# Patient Record
Sex: Male | Born: 1960 | Race: White | Hispanic: No | Marital: Single | Smoking: Never smoker
Health system: Southern US, Community
[De-identification: ages and names within clinical notes are randomized; demographics above are authoritative.]

## PROBLEM LIST (undated history)

## (undated) ENCOUNTER — Emergency Department (HOSPITAL_COMMUNITY): Admission: EM | Payer: BLUE CROSS/BLUE SHIELD | Source: Home / Self Care

## (undated) DIAGNOSIS — R253 Fasciculation: Secondary | ICD-10-CM

## (undated) DIAGNOSIS — J3800 Paralysis of vocal cords and larynx, unspecified: Secondary | ICD-10-CM

## (undated) DIAGNOSIS — M545 Low back pain, unspecified: Secondary | ICD-10-CM

## (undated) DIAGNOSIS — G43909 Migraine, unspecified, not intractable, without status migrainosus: Secondary | ICD-10-CM

## (undated) DIAGNOSIS — B029 Zoster without complications: Secondary | ICD-10-CM

## (undated) HISTORY — DX: Low back pain: M54.5

## (undated) HISTORY — DX: Zoster without complications: B02.9

## (undated) HISTORY — PX: HAND SURGERY: SHX662

## (undated) HISTORY — DX: Paralysis of vocal cords and larynx, unspecified: J38.00

## (undated) HISTORY — DX: Fasciculation: R25.3

## (undated) HISTORY — DX: Low back pain, unspecified: M54.50

## (undated) HISTORY — PX: HERNIA REPAIR: SHX51

## (undated) HISTORY — DX: Migraine, unspecified, not intractable, without status migrainosus: G43.909

---

## 1998-10-27 ENCOUNTER — Ambulatory Visit (HOSPITAL_COMMUNITY): Admission: RE | Admit: 1998-10-27 | Discharge: 1998-10-27 | Payer: Self-pay | Admitting: Urology

## 1998-10-27 ENCOUNTER — Encounter: Payer: Self-pay | Admitting: Urology

## 2002-07-05 ENCOUNTER — Encounter: Payer: Self-pay | Admitting: *Deleted

## 2002-07-05 ENCOUNTER — Emergency Department (HOSPITAL_COMMUNITY): Admission: EM | Admit: 2002-07-05 | Discharge: 2002-07-05 | Payer: Self-pay | Admitting: Emergency Medicine

## 2013-09-30 ENCOUNTER — Other Ambulatory Visit: Payer: Self-pay | Admitting: Internal Medicine

## 2013-09-30 ENCOUNTER — Ambulatory Visit
Admission: RE | Admit: 2013-09-30 | Discharge: 2013-09-30 | Disposition: A | Discharge status: Home / Self Care | Payer: BC Managed Care – PPO | Source: Ambulatory Visit | Attending: Internal Medicine | Admitting: Internal Medicine

## 2013-09-30 DIAGNOSIS — R0989 Other specified symptoms and signs involving the circulatory and respiratory systems: Principal | ICD-10-CM

## 2013-09-30 DIAGNOSIS — R0609 Other forms of dyspnea: Secondary | ICD-10-CM

## 2013-10-01 ENCOUNTER — Other Ambulatory Visit: Payer: Self-pay

## 2013-10-01 ENCOUNTER — Ambulatory Visit (INDEPENDENT_AMBULATORY_CARE_PROVIDER_SITE_OTHER): Payer: BC Managed Care – PPO | Admitting: Physician Assistant

## 2013-10-01 DIAGNOSIS — R0989 Other specified symptoms and signs involving the circulatory and respiratory systems: Principal | ICD-10-CM

## 2013-10-01 DIAGNOSIS — R0609 Other forms of dyspnea: Secondary | ICD-10-CM

## 2013-10-01 NOTE — Progress Notes (Signed)
Exercise Treadmill Test  Jonathan IsaacsRobert Winters is a 53 y.o. male non-smoker with no hx of CAD, HTN, DM, HL.  Referred by PCP for ETT to evaluate CP.  Patient notes decreased exercise tolerance.  No syncope.  Exam unremarkable.  ECG normal.  Pre-Exercise Testing Evaluation Rhythm: sinus tachycardia  Rate: 93 bpm     Test  Exercise Tolerance Test Ordering MD: Donato SchultzMark Skains, MD  Interpreting MD: Tereso NewcomerScott Weaver, PA-C  Unique Test No: 1  Treadmill:  1  Indication for ETT: exertional dyspnea  Contraindication to ETT: No   Stress Modality: exercise - treadmill  Cardiac Imaging Performed: non   Protocol: standard Bruce - maximal  Max BP:  183/80  Max MPHR (bpm):  168 85% MPR (bpm):  143  MPHR obtained (bpm):  179 % MPHR obtained:  106  Reached 85% MPHR (min:sec):  3:35 Total Exercise Time (min-sec):  8:00  Workload in METS:  10.1 Borg Scale: 13  Reason ETT Terminated:  desired heart rate attained    ST Segment Analysis At Rest: normal ST segments - no evidence of significant ST depression With Exercise: no evidence of significant ST depression  Other Information Arrhythmia:  No Angina during ETT:  absent (0) Quality of ETT:  diagnostic  ETT Interpretation:  normal - no evidence of ischemia by ST analysis  Comments: Good exercise capacity. No chest pain. Normal BP response to exercise. No ST changes to suggest ischemia.   Recommendations: FU with PCP as directed. Signed,  Tereso NewcomerScott Weaver, PA-C   10/01/2013 9:57 AM

## 2014-03-15 ENCOUNTER — Emergency Department (HOSPITAL_COMMUNITY): Payer: BLUE CROSS/BLUE SHIELD

## 2014-03-15 ENCOUNTER — Encounter (HOSPITAL_COMMUNITY): Payer: Self-pay | Admitting: Oncology

## 2014-03-15 ENCOUNTER — Emergency Department (HOSPITAL_COMMUNITY)
Admission: EM | Admit: 2014-03-15 | Discharge: 2014-03-15 | Disposition: A | Discharge status: Home / Self Care | Payer: BLUE CROSS/BLUE SHIELD | Attending: Emergency Medicine | Admitting: Emergency Medicine

## 2014-03-15 DIAGNOSIS — R109 Unspecified abdominal pain: Secondary | ICD-10-CM | POA: Diagnosis not present

## 2014-03-15 DIAGNOSIS — Z9889 Other specified postprocedural states: Secondary | ICD-10-CM | POA: Insufficient documentation

## 2014-03-15 DIAGNOSIS — Z87442 Personal history of urinary calculi: Secondary | ICD-10-CM | POA: Diagnosis not present

## 2014-03-15 DIAGNOSIS — Z7982 Long term (current) use of aspirin: Secondary | ICD-10-CM | POA: Insufficient documentation

## 2014-03-15 LAB — URINALYSIS, ROUTINE W REFLEX MICROSCOPIC
BILIRUBIN URINE: NEGATIVE
GLUCOSE, UA: NEGATIVE mg/dL
KETONES UR: NEGATIVE mg/dL
LEUKOCYTES UA: NEGATIVE
Nitrite: NEGATIVE
Protein, ur: NEGATIVE mg/dL
Specific Gravity, Urine: 1.021 (ref 1.005–1.030)
Urobilinogen, UA: 1 mg/dL (ref 0.0–1.0)
pH: 5.5 (ref 5.0–8.0)

## 2014-03-15 LAB — COMPREHENSIVE METABOLIC PANEL
ALBUMIN: 4.9 g/dL (ref 3.5–5.2)
ALK PHOS: 78 U/L (ref 39–117)
ALT: 22 U/L (ref 0–53)
AST: 24 U/L (ref 0–37)
Anion gap: 8 (ref 5–15)
BILIRUBIN TOTAL: 1.8 mg/dL — AB (ref 0.3–1.2)
BUN: 9 mg/dL (ref 6–23)
CALCIUM: 9.1 mg/dL (ref 8.4–10.5)
CO2: 26 mmol/L (ref 19–32)
Chloride: 104 mmol/L (ref 96–112)
Creatinine, Ser: 0.88 mg/dL (ref 0.50–1.35)
GFR calc Af Amer: >90 mL/min (ref >90)
Glucose, Bld: 102 mg/dL — ABNORMAL HIGH (ref 70–99)
Potassium: 3.8 mmol/L (ref 3.5–5.1)
Sodium: 138 mmol/L (ref 135–145)
Total Protein: 8.1 g/dL (ref 6.0–8.3)

## 2014-03-15 LAB — CBC WITH DIFFERENTIAL/PLATELET
Basophils Absolute: 0 10*3/uL (ref 0.0–0.1)
Basophils Relative: 0 % (ref 0–1)
Eosinophils Absolute: 0.1 10*3/uL (ref 0.0–0.7)
Eosinophils Relative: 1 % (ref 0–5)
HCT: 49.3 % (ref 39.0–52.0)
Hemoglobin: 17.6 g/dL — ABNORMAL HIGH (ref 13.0–17.0)
LYMPHS ABS: 2.6 10*3/uL (ref 0.7–4.0)
LYMPHS PCT: 26 % (ref 12–46)
MCH: 29 pg (ref 26.0–34.0)
MCHC: 35.7 g/dL (ref 30.0–36.0)
MCV: 81.4 fL (ref 78.0–100.0)
MONO ABS: 1.1 10*3/uL — AB (ref 0.1–1.0)
Monocytes Relative: 11 % (ref 3–12)
NEUTROS ABS: 6.3 10*3/uL (ref 1.7–7.7)
Neutrophils Relative %: 62 % (ref 43–77)
Platelets: 178 10*3/uL (ref 150–400)
RBC: 6.06 MIL/uL — ABNORMAL HIGH (ref 4.22–5.81)
RDW: 13.3 % (ref 11.5–15.5)
WBC: 10.1 10*3/uL (ref 4.0–10.5)

## 2014-03-15 LAB — URINE MICROSCOPIC-ADD ON

## 2014-03-15 MED ORDER — ONDANSETRON HCL 4 MG PO TABS
4.0000 mg | ORAL_TABLET | Freq: Three times a day (TID) | ORAL | Status: DC | PRN
Start: 2014-03-15 — End: 2015-11-23

## 2014-03-15 MED ORDER — KETOROLAC TROMETHAMINE 30 MG/ML IJ SOLN
30.0000 mg | Freq: Once | INTRAMUSCULAR | Status: AC
  Administered 2014-03-15: 30 mg via INTRAVENOUS
  Filled 2014-03-15: qty 1

## 2014-03-15 MED ORDER — OXYCODONE-ACETAMINOPHEN 5-325 MG PO TABS
1.0000 | ORAL_TABLET | Freq: Once | ORAL | Status: AC
  Administered 2014-03-15: 1 via ORAL
  Filled 2014-03-15: qty 1

## 2014-03-15 MED ORDER — HYDROCODONE-ACETAMINOPHEN 5-325 MG PO TABS: 1.0000 | ORAL_TABLET | Freq: Four times a day (QID) | ORAL | Status: DC | PRN

## 2014-03-15 NOTE — Discharge Instructions (Signed)
Flank Pain °Flank pain refers to pain that is located on the side of the body between the upper abdomen and the back. The pain may occur over a short period of time (acute) or may be long-term or reoccurring (chronic). It may be mild or severe. Flank pain can be caused by many things. °CAUSES  °Some of the more common causes of flank pain include: °· Muscle strains.   °· Muscle spasms.   °· A disease of your spine (vertebral disk disease).   °· A lung infection (pneumonia).   °· Fluid around your lungs (pulmonary edema).   °· A kidney infection.   °· Kidney stones.   °· A very painful skin rash caused by the chickenpox virus (shingles).   °· Gallbladder disease.   °HOME CARE INSTRUCTIONS  °Home care will depend on the cause of your pain. In general, °· Rest as directed by your caregiver. °· Drink enough fluids to keep your urine clear or pale yellow. °· Only take over-the-counter or prescription medicines as directed by your caregiver. Some medicines may help relieve the pain. °· Tell your caregiver about any changes in your pain. °· Follow up with your caregiver as directed. °SEEK IMMEDIATE MEDICAL CARE IF:  °· Your pain is not controlled with medicine.   °· You have new or worsening symptoms. °· Your pain increases.   °· You have abdominal pain.   °· You have shortness of breath.   °· You have persistent nausea or vomiting.   °· You have swelling in your abdomen.   °· You feel faint or pass out.   °· You have blood in your urine. °· You have a fever or persistent symptoms for more than 2-3 days. °· You have a fever and your symptoms suddenly get worse. °MAKE SURE YOU:  °· Understand these instructions. °· Will watch your condition. °· Will get help right away if you are not doing well or get worse. °Document Released: 02/09/2005 Document Revised: 09/13/2011 Document Reviewed: 08/03/2011 °ExitCare® Patient Information ©2015 [employer]. This information is not intended to replace advice given to you by your  health care provider. Make sure you discuss any questions you have with your health care provider. ° °

## 2014-03-15 NOTE — ED Notes (Signed)
Pain has decreased in the lower back but lower abd pain is radiating to upper/mid abd pain.

## 2014-03-15 NOTE — ED Provider Notes (Signed)
CSN: [id number]     Arrival date & time 03/15/14  1951 History   First MD Initiated Contact with Patient 03/15/14 2100     Chief Complaint  Patient presents with  . Flank Pain     (Consider location/radiation/quality/duration/timing/severity/associated sxs/prior Treatment) HPI   54 year old male with no prior history of kidney stone presents for evaluation of left flank pain. Patient reports for the past 2 days he has had intermittent left flank pain. He described pain as a dull achy sensation with occasional sharp intense pain that is waxing and waning. He felt that it may be a pulled muscle but despite positional change to notice no improvement. Today the pain is traveling down to his left low abdomen. Pain is 9 out of 10, mildly improved with taking ibuprofen and Excedrin at home. He was unable to sleep due to the pain. He did felt nauseous once but did not vomit. No associated fever, chills, chest pain, shortness of breath, productive cough, dysuria, hematuria, hematochezia or melena. Is able to pass flatus, last bowel movement was today. No prior history of kidney stones. Denies any specific injury. States that he pulled muscle in the past but this felt different. He did receive 1 Percocet while waiting in states it brought the pain from a 9 out of 10 to 8 out of 10. Prior history of inguinal hernia repair  History reviewed. No pertinent past medical history. Past Surgical History  Procedure Laterality Date  . Hernia repair     History reviewed. No pertinent family history. History  Substance Use Topics  . Smoking status: Never Smoker   . Smokeless tobacco: Not on file  . Alcohol Use: Yes     Comment: occasionally    Review of Systems  Constitutional: Negative for fever.  Gastrointestinal: Positive for abdominal pain.  Genitourinary: Positive for flank pain. Negative for dysuria, scrotal swelling and testicular pain.  Skin: Negative for rash and wound.  Neurological: Negative  for numbness.      Allergies  Review of patient's allergies indicates no known allergies.  Home Medications   Prior to Admission medications   Medication Sig Start Date End Date Taking? Authorizing Provider  aspirin-acetaminophen-caffeine (EXCEDRIN MIGRAINE) [phone] MG per tablet Take 2 tablets by mouth every 6 (six) hours as needed (back pain).   Yes Historical Provider, MD  ibuprofen (ADVIL,MOTRIN) 200 MG tablet Take 400 mg by mouth every 6 (six) hours as needed for moderate pain (pain).   Yes Historical Provider, MD   BP 153/100 mmHg  Pulse 93  Temp(Src) 97.9 F (36.6 C) (Oral)  Resp 16  Ht  (1.753 m)  Wt 195 lb (88.451 kg)  BMI 28.78 kg/m2  SpO2 100% Physical Exam  Constitutional: He appears well-developed and well-nourished. No distress.  HENT:  Head: Atraumatic.  Eyes: Conjunctivae are normal.  Neck: Normal range of motion. Neck supple.  Cardiovascular: Normal rate and regular rhythm.   Pulmonary/Chest: Effort normal and breath sounds normal.  Abdominal: Soft. Bowel sounds are normal. He exhibits no distension. There is no tenderness.  Genitourinary:  Mild left CVA tenderness on percussion. No significant midline spine tenderness  Chaperone present during exam. Patient has normal circumcised penis with normal penile shaft, no testicular tenderness on palpation, no inguinal hernia noted.  Neurological: He is alert.  Skin: No rash noted.  Psychiatric: He has a normal mood and affect.    ED Course  Procedures (including critical care time)  Patient presents with left flank pain suggestive  of kidney stones. Workup initiated. Pain medication given. He has a benign abdominal exam.  11:26 PM Labs are reassuring, UA with trace hemoglobin, but no signs of urinary tract infection. CT renal stone demonstrated right nephrolithiasis but otherwise no acute finding. On reexamination, patient has a benign abdomen, he is able to tolerate by mouth and felt better. I  suspect patient may have passed a kidney stone. We'll give outpatient follow-up with urology as needed. Return precautions discussed.  Labs Review Labs Reviewed  CBC WITH DIFFERENTIAL/PLATELET - Abnormal; Notable for the following:    RBC 6.06 (*)    Hemoglobin 17.6 (*)    Monocytes Absolute 1.1 (*)    All other components within normal limits  COMPREHENSIVE METABOLIC PANEL - Abnormal; Notable for the following:    Glucose, Bld 102 (*)    Total Bilirubin 1.8 (*)    All other components within normal limits  URINALYSIS, ROUTINE W REFLEX MICROSCOPIC - Abnormal; Notable for the following:    Color, Urine AMBER (*)    Hgb urine dipstick TRACE (*)    All other components within normal limits  URINE MICROSCOPIC-ADD ON - Abnormal; Notable for the following:    Bacteria, UA FEW (*)    All other components within normal limits    Imaging Review Ct Renal Stone Study  03/15/2014   CLINICAL DATA:  Intermittent left flank pain, 2-3 day duration. Initial encounter  EXAM: CT ABDOMEN AND PELVIS WITHOUT CONTRAST  TECHNIQUE: Multidetector CT imaging of the abdomen and pelvis was performed following the standard protocol without IV contrast.  COMPARISON:  None.  FINDINGS: There is a 3 x 5 mm upper pole right renal collecting system calculus. There is no ureteral calculus. There is no hydronephrosis or ureteral dilatation. There are unremarkable unenhanced appearances of the liver, spleen, pancreas, adrenals and appendix. Abdominal aorta is normal in caliber. There is no acute inflammatory change in the abdomen or pelvis. There is no adenopathy. There is no ascites. There is no significant abnormality in the lower chest. No significant musculoskeletal abnormalities are evident.  IMPRESSION: Right nephrolithiasis.  No acute findings.   Electronically Signed   By: Ellery Plunkaniel R Mitchell M.D.   On: 03/15/2014 22:03     EKG Interpretation None      MDM   Final diagnoses:  Left flank pain    BP 153/100 mmHg   Pulse 93  Temp(Src) 97.9 F (36.6 C) (Oral)  Resp 16  Ht 5\' 9"  (1.753 m)  Wt 195 lb (88.451 kg)  BMI 28.78 kg/m2  SpO2 100%  I have reviewed nursing notes and vital signs. I personally reviewed the imaging tests through PACS system  I reviewed available ER/hospitalization records thought the EMR     Fayrene HelperBowie Aubreyanna Dorrough, PA-C 03/15/14 2341  Nelva Nayobert Beaton, MD 03/19/14 [phone]

## 2014-03-15 NOTE — ED Notes (Signed)
Denies blood in urine or problems with urinating.

## 2014-03-15 NOTE — ED Notes (Signed)
Pt began to have intermittent left flank pain Friday.  Pt rates pain 9/10, sharp in nature.  Pt is also c/o lower b/l abdominal pain.  Pt has been unable to sleep d/t pain.

## 2015-10-26 ENCOUNTER — Ambulatory Visit
Admission: RE | Admit: 2015-10-26 | Discharge: 2015-10-26 | Disposition: A | Discharge status: Home / Self Care | Payer: BLUE CROSS/BLUE SHIELD | Source: Ambulatory Visit | Attending: Internal Medicine | Admitting: Internal Medicine

## 2015-10-26 ENCOUNTER — Other Ambulatory Visit: Payer: Self-pay | Admitting: Internal Medicine

## 2015-10-26 DIAGNOSIS — Z0001 Encounter for general adult medical examination with abnormal findings: Secondary | ICD-10-CM

## 2015-10-26 DIAGNOSIS — M542 Cervicalgia: Secondary | ICD-10-CM

## 2015-11-04 ENCOUNTER — Other Ambulatory Visit: Payer: Self-pay | Admitting: Internal Medicine

## 2015-11-04 DIAGNOSIS — M5412 Radiculopathy, cervical region: Secondary | ICD-10-CM

## 2015-11-18 ENCOUNTER — Other Ambulatory Visit: Payer: BLUE CROSS/BLUE SHIELD

## 2015-11-23 ENCOUNTER — Ambulatory Visit (INDEPENDENT_AMBULATORY_CARE_PROVIDER_SITE_OTHER): Payer: BLUE CROSS/BLUE SHIELD | Admitting: Neurology

## 2015-11-23 ENCOUNTER — Encounter: Payer: Self-pay | Admitting: Neurology

## 2015-11-23 VITALS — BP 142/94 | HR 98 | Ht 69.0 in | Wt 207.0 lb

## 2015-11-23 DIAGNOSIS — R251 Tremor, unspecified: Secondary | ICD-10-CM | POA: Insufficient documentation

## 2015-11-23 MED ORDER — PROPRANOLOL HCL 10 MG PO TABS: ORAL_TABLET | ORAL | 2 refills | Status: DC

## 2015-11-23 NOTE — Patient Instructions (Signed)
   Inderal (propranolol) is a blood pressure medication that is commonly used for migraine headaches. This is a type of beta blocker. The most common side effects include low heart rate, dizziness, fatigue, and increased depression. This medication may worsen asthma. If you believe that you are having side effects on this medication, please contact our office.  

## 2015-11-23 NOTE — Progress Notes (Signed)
Reason for visit: Tremor  Referring physician: Dr. Birdie SonsGates  Jonathan Winters is a 55 y.o. male  History of present illness:  [name] is a 55 year old right-handed white male with a history of diffuse fasciculations throughout the body over the last 18 years. This has been unassociated with any weakness. The patient denies any muscle cramps. The patient may have some stiffness sensations in the calf muscles bilaterally. Six months ago, the patient began noting some tremor involving the left hand primarily. He will note a tremor when he extends the fingers of the hand or when he flexes the fingers against a load. The fourth and fifth fingers are affected out of proportion to the other fingers of the left hand, he does also have a mild tremor involving the right hand too. The patient has no component of a resting tremor. The patient has not had any head or neck tremor, no vocal tremor is noted. There is no family history of tremor. The patient reports no numbness or weakness of the extremities, he denies any issues controlling the bowels or the bladder, he denies any falls. He does have a history of occasional migraine headaches. In the past he has had some problems with dizziness with standing, this goes away when he sits or lies down. He does report some neck discomfort, but he denies any pain going down the arms from the neck. He comes to this office for an evaluation. A thyroid study has been done recently, the patient was told that this was unremarkable.   Past Medical History:  Diagnosis Date  . Fasciculations   . Lower back pain   . Migraine   . Shingles   . Vocal cord paralysis     Past Surgical History:  Procedure Laterality Date  . HAND SURGERY Left   . HERNIA REPAIR      Family History  Problem Relation Age of Onset  . Congestive Heart Failure Father   . COPD Father     Social history:  reports that he has never smoked. He has never used smokeless tobacco. He reports that  he drinks alcohol. He reports that he does not use drugs.  Medications:  Prior to Admission medications   Medication Sig Start Date End Date Taking? Authorizing Provider  aspirin-acetaminophen-caffeine (EXCEDRIN MIGRAINE) [phone] MG per tablet Take 2 tablets by mouth every 6 (six) hours as needed for headache.    Yes Historical Provider, MD     No Known Allergies  ROS:  Out of a complete 14 system review of symptoms, the patient complains only of the following symptoms, and all other reviewed systems are negative.  Joint pain, achy muscles Tremor  Blood pressure (!) 142/94, pulse 98, height 5\' 9"  (1.753 m), weight 207 lb (93.9 kg).  Physical Exam  General: The patient is alert and cooperative at the time of the examination.  Eyes: Pupils are equal, round, and reactive to light. Discs are flat bilaterally.  Neck: The neck is supple, no carotid bruits are noted.  Respiratory: The respiratory examination is clear.  Cardiovascular: The cardiovascular examination reveals a regular rate and rhythm, no obvious murmurs or rubs are noted.  Skin: Extremities are without significant edema.  Neurologic Exam  Mental status: The patient is alert and oriented x 3 at the time of the examination. The patient has apparent normal recent and remote memory, with an apparently normal attention span and concentration ability.  Cranial nerves: Facial symmetry is present. There is good sensation  of the face to pinprick and soft touch bilaterally. The strength of the facial muscles and the muscles to head turning and shoulder shrug are normal bilaterally. Speech is well enunciated, no aphasia or dysarthria is noted. Extraocular movements are full. Visual fields are full. The tongue is midline, and the patient has symmetric elevation of the soft palate. No obvious hearing deficits are noted.  Motor: The motor testing reveals 5 over 5 strength of all 4 extremities. Good symmetric motor tone is noted  throughout.  Sensory: Sensory testing is intact to pinprick, soft touch, vibration sensation, and position sense on all 4 extremities. No evidence of extinction is noted.  Coordination: Cerebellar testing reveals good finger-nose-finger and heel-to-shin bilaterally. The patient has a fine action type tremor with finger-nose-finger bilaterally. With arms outstretched, the patient has a prominent tremor of the left hand affecting the fourth and fifth fingers most significantly.  Gait and station: Gait is normal. Tandem gait is normal. Romberg is negative. No drift is seen.  Reflexes: Deep tendon reflexes are symmetric and normal bilaterally. Toes are downgoing bilaterally.   Assessment/Plan:  1. Tremor, physiologic type  The patient has a fine action tremor that is similar to a physiologic tremor. The patient however has not had this tremor throughout his life. The patient has had a thyroid study done recently. He will be set up for MRI evaluation of the brain to exclude demyelinating disease or cerebrovascular disease as an etiology. The patient will be placed on propranolol to help reduce the tremor severity. He will follow-up in 3 or 4 months.  Marlan Palau. Keith Willis MD 11/23/2015 2:22 PM  [facility] [address] [place], [place] [id number]  Phone [phone] Fax [phone]

## 2015-11-24 ENCOUNTER — Telehealth: Payer: Self-pay | Admitting: Neurology

## 2015-11-24 NOTE — Telephone Encounter (Signed)
I called for the peer to peer review, the MRI the brain has been authorized. The number is [id number], expiration date 01/22/2016.

## 2015-11-24 NOTE — Telephone Encounter (Signed)
This patient MRI is requiring a P2P please call [phone] within the next two business days. Since there is a holiday it will close Tuesday the 28th. Reference number not given.

## 2015-11-29 NOTE — Telephone Encounter (Signed)
Noted, thank you

## 2015-12-06 ENCOUNTER — Telehealth: Payer: Self-pay | Admitting: Neurology

## 2015-12-06 NOTE — Telephone Encounter (Signed)
Spoke with the patient informed him that the order was sent to GI and the Berkley Harveyauth has been taking care of.. He stated that [facility] and scheduled [facility].

## 2015-12-06 NOTE — Telephone Encounter (Signed)
Pt called inquiring about MRI. Msg relayed the order was sent to GI. Pt said that is ok but at OV [facility]. Please call, he wants this done before the end of the month.

## 2015-12-09 ENCOUNTER — Ambulatory Visit
Admission: RE | Admit: 2015-12-09 | Discharge: 2015-12-09 | Disposition: A | Discharge status: Home / Self Care | Payer: BLUE CROSS/BLUE SHIELD | Source: Ambulatory Visit | Attending: Neurology | Admitting: Neurology

## 2015-12-09 DIAGNOSIS — R251 Tremor, unspecified: Secondary | ICD-10-CM

## 2015-12-09 MED ORDER — GADOBENATE DIMEGLUMINE 529 MG/ML IV SOLN
19.0000 mL | Freq: Once | INTRAVENOUS | Status: AC | PRN
  Administered 2015-12-09: 19 mL via INTRAVENOUS

## 2015-12-10 ENCOUNTER — Telehealth: Payer: Self-pay | Admitting: Neurology

## 2015-12-10 NOTE — Telephone Encounter (Signed)
I called the patient. The MRI of the brain is normal. He is to start the propranolol soon.   MRI brain 12/10/15:  IMPRESSION:  Unremarkable MRI scan of the brain with and without contrast

## 2016-03-22 ENCOUNTER — Encounter: Payer: Self-pay | Admitting: Neurology

## 2016-03-22 ENCOUNTER — Ambulatory Visit (INDEPENDENT_AMBULATORY_CARE_PROVIDER_SITE_OTHER): Payer: BLUE CROSS/BLUE SHIELD | Admitting: Neurology

## 2016-03-22 VITALS — BP 147/94 | HR 103 | Resp 20 | Ht 69.0 in | Wt 216.0 lb

## 2016-03-22 DIAGNOSIS — R251 Tremor, unspecified: Secondary | ICD-10-CM | POA: Diagnosis not present

## 2016-03-22 NOTE — Progress Notes (Signed)
Reason for visit: Tremor  Jonathan Winters is an 56 y.o. male  History of present illness:  Jonathan Winters is a 56 year old right-handed white male with a history of classic migraine headache and a history of tremor. The patient has tremor that is most prominent on the left hand affecting the fourth and fifth fingers primarily, but he does have tremor on both sides with a fine physiologic type tremor. He does have some troubles with typing. He was placed on propranolol, but he took the medication only for a short time. He was taking a very low dose and he did not note any significant benefit with the tremor. The patient gives a history of problems with exercise intolerance that has been present for at least 2 years, he had a stress test done around that time that was unremarkable. He has had some problems with an elevated heart rate over at least 2 years running in the 90s. Within the last several months he has noted increasing problems with near syncopal events with standing up while at work. He will sit at his workstation for 2 or 3 hours at a time, when he stands up he may get dizzy, muffled hearing, and a sensation of near-syncope. He will have tunnel vision. After a few moments, this resolves and he feels better. He has not actually blacked out. The events are occurring almost daily. He denies any problems when he first gets out of bed in the morning. The patient has had gradual worsening of exercise intolerance, he is no longer able to work out or run. He denies any chest pains, he does have palpitations of the heart at times with heart racing.  Past Medical History:  Diagnosis Date  . Fasciculations   . Lower back pain   . Migraine   . Shingles   . Vocal cord paralysis     Past Surgical History:  Procedure Laterality Date  . HAND SURGERY Left   . HERNIA REPAIR      Family History  Problem Relation Age of Onset  . Congestive Heart Failure Father   . COPD Father     Social history:   reports that he has never smoked. He has never used smokeless tobacco. He reports that he drinks alcohol. He reports that he does not use drugs.   No Known Allergies  Medications:  Prior to Admission medications   Medication Sig Start Date End Date Taking? Authorizing Provider  aspirin-acetaminophen-caffeine (EXCEDRIN MIGRAINE) [phone] MG per tablet Take 2 tablets by mouth every 6 (six) hours as needed for headache.    Yes Historical Provider, MD  propranolol (INDERAL) 10 MG tablet One tablet twice a day for 2 weeks, then take 2 tablets twice a day Patient not taking: Reported on 03/22/2016 11/23/15   York Spaniel, MD    ROS:  Out of a complete 14 system review of symptoms, the patient complains only of the following symptoms, and all other reviewed systems are negative.  Dizziness, tremors Neck pain  Blood pressure (!) 147/94, pulse (!) 103, resp. rate 20, height 5\' 9"  (1.753 m), weight 216 lb (98 kg).   Blood pressure, right arm, sitting is 144/88 with a heart rate of 96. Blood pressure, right arm, standing is 144/80 heart rate 96.  Physical Exam  General: The patient is alert and cooperative at the time of the examination.  Skin: No significant peripheral edema is noted.   Neurologic Exam  Mental status: The patient is alert and  oriented x 3 at the time of the examination. The patient has apparent normal recent and remote memory, with an apparently normal attention span and concentration ability.   Cranial nerves: Facial symmetry is present. Speech is normal, no aphasia or dysarthria is noted. Extraocular movements are full. Visual fields are full. Pupils are equal, round, and reactive to light. Discs are flat bilaterally.  Motor: The patient has good strength in all 4 extremities.  Sensory examination: Soft touch sensation is symmetric on the face, arms, and legs.  Coordination: The patient has good finger-nose-finger and heel-to-shin bilaterally. The patient has  tremors with the arms outstretched involving primarily the left hand with the fourth and fifth fingers, a fine action tremor is seen bilaterally with finger-nose-finger, worse on the left.  Gait and station: The patient has a normal gait. Tandem gait is normal. Romberg is negative. No drift is seen.  Reflexes: Deep tendon reflexes are symmetric.    MRI brain 12/10/15:  IMPRESSION: Unremarkable MRI scan of the brain with and without contrast  * MRI scan images were reviewed online. I agree with the written report.    Assessment/Plan:  1. Left hand tremor  2. Classic migraine headache  3. Exercise intolerance, reports of near-syncope  The patient has had gradual worsening problems with exercise intolerance, he chronically appears to run heart rates in the 90s. He does not appear to be orthostatic on the examination today, but his description of symptoms appears to be consistent with transient episodes of low blood pressure with standing after prolonged periods of sitting. The patient will be sent for blood work today, I have encouraged him to contact his primary care physician regarding the symptoms of exercise intolerance, he may require a cardiac workup again. The patient otherwise will follow-up in about 8 months, he has decided not to go on medication such as propranolol for the tremor. MRI of the brain done previously was normal.  C. Lesia SagoKeith Mahlik Lenn MD 03/22/2016 7:51 AM  [facility] [address] [place], [place] [id number]  Phone [phone] Fax [phone]

## 2016-03-23 ENCOUNTER — Telehealth: Payer: Self-pay | Admitting: *Deleted

## 2016-03-23 LAB — COMPREHENSIVE METABOLIC PANEL
A/G RATIO: 1.8 (ref 1.2–2.2)
ALK PHOS: 97 IU/L (ref 39–117)
ALT: 35 IU/L (ref 0–44)
AST: 30 IU/L (ref 0–40)
Albumin: 4.6 g/dL (ref 3.5–5.5)
BILIRUBIN TOTAL: 0.9 mg/dL (ref 0.0–1.2)
BUN / CREAT RATIO: 16 (ref 9–20)
BUN: 16 mg/dL (ref 6–24)
CO2: 23 mmol/L (ref 18–29)
Calcium: 9.3 mg/dL (ref 8.7–10.2)
Chloride: 99 mmol/L (ref 96–106)
Creatinine, Ser: 1 mg/dL (ref 0.76–1.27)
GFR calc Af Amer: 98 mL/min/{1.73_m2} (ref >59)
GFR calc non Af Amer: 84 mL/min/{1.73_m2} (ref >59)
GLOBULIN, TOTAL: 2.6 g/dL (ref 1.5–4.5)
Glucose: 105 mg/dL — ABNORMAL HIGH (ref 65–99)
Potassium: 5.1 mmol/L (ref 3.5–5.2)
SODIUM: 138 mmol/L (ref 134–144)
Total Protein: 7.2 g/dL (ref 6.0–8.5)

## 2016-03-23 LAB — TSH: TSH: 2.54 u[IU]/mL (ref 0.450–4.500)

## 2016-03-23 NOTE — Telephone Encounter (Signed)
Called and spoke with patient about lab results per CW,MD note.  Per last office note, advised he should u with PCP regarding exercise intolerance. They may refer him to cardiology to do further testing if needed. He verbalized understanding.

## 2016-03-23 NOTE — Telephone Encounter (Signed)
-----   Message from York Spanielharles K Willis, MD sent at 03/23/2016  7:16 AM EDT -----  The blood work results are unremarkable. Please call the patient.  ----- Message ----- From: Nell RangeInterface, [facility] Lab Results In Sent: 03/23/2016   5:41 AM To: York Spanielharles K Willis, MD

## 2016-11-22 ENCOUNTER — Ambulatory Visit: Payer: BLUE CROSS/BLUE SHIELD | Admitting: Neurology

## 2017-06-22 ENCOUNTER — Other Ambulatory Visit: Payer: Self-pay | Admitting: Internal Medicine

## 2017-06-22 ENCOUNTER — Ambulatory Visit
Admission: RE | Admit: 2017-06-22 | Discharge: 2017-06-22 | Disposition: A | Discharge status: Home / Self Care | Payer: BLUE CROSS/BLUE SHIELD | Source: Ambulatory Visit | Attending: Internal Medicine | Admitting: Internal Medicine

## 2017-06-22 DIAGNOSIS — R0789 Other chest pain: Secondary | ICD-10-CM | POA: Diagnosis not present

## 2017-06-22 DIAGNOSIS — M5414 Radiculopathy, thoracic region: Secondary | ICD-10-CM | POA: Diagnosis not present

## 2017-10-01 DIAGNOSIS — D229 Melanocytic nevi, unspecified: Secondary | ICD-10-CM | POA: Diagnosis not present

## 2017-10-01 DIAGNOSIS — L814 Other melanin hyperpigmentation: Secondary | ICD-10-CM | POA: Diagnosis not present

## 2017-10-01 DIAGNOSIS — L821 Other seborrheic keratosis: Secondary | ICD-10-CM | POA: Diagnosis not present

## 2017-10-31 DIAGNOSIS — N529 Male erectile dysfunction, unspecified: Secondary | ICD-10-CM | POA: Diagnosis not present

## 2017-10-31 DIAGNOSIS — E785 Hyperlipidemia, unspecified: Secondary | ICD-10-CM | POA: Diagnosis not present

## 2017-10-31 DIAGNOSIS — R251 Tremor, unspecified: Secondary | ICD-10-CM | POA: Diagnosis not present

## 2017-10-31 DIAGNOSIS — Z23 Encounter for immunization: Secondary | ICD-10-CM | POA: Diagnosis not present

## 2017-10-31 DIAGNOSIS — E559 Vitamin D deficiency, unspecified: Secondary | ICD-10-CM | POA: Diagnosis not present

## 2017-10-31 DIAGNOSIS — Z79899 Other long term (current) drug therapy: Secondary | ICD-10-CM | POA: Diagnosis not present

## 2017-10-31 DIAGNOSIS — Z125 Encounter for screening for malignant neoplasm of prostate: Secondary | ICD-10-CM | POA: Diagnosis not present

## 2017-10-31 DIAGNOSIS — Z0001 Encounter for general adult medical examination with abnormal findings: Secondary | ICD-10-CM | POA: Diagnosis not present

## 2017-10-31 DIAGNOSIS — B360 Pityriasis versicolor: Secondary | ICD-10-CM | POA: Diagnosis not present

## 2017-11-15 DIAGNOSIS — Z86010 Personal history of colonic polyps: Secondary | ICD-10-CM | POA: Diagnosis not present

## 2017-11-15 DIAGNOSIS — Z01818 Encounter for other preprocedural examination: Secondary | ICD-10-CM | POA: Diagnosis not present

## 2017-12-10 DIAGNOSIS — K648 Other hemorrhoids: Secondary | ICD-10-CM | POA: Diagnosis not present

## 2017-12-10 DIAGNOSIS — K6289 Other specified diseases of anus and rectum: Secondary | ICD-10-CM | POA: Diagnosis not present

## 2017-12-10 DIAGNOSIS — K620 Anal polyp: Secondary | ICD-10-CM | POA: Diagnosis not present

## 2017-12-10 DIAGNOSIS — Z86010 Personal history of colonic polyps: Secondary | ICD-10-CM | POA: Diagnosis not present

## 2017-12-10 DIAGNOSIS — D122 Benign neoplasm of ascending colon: Secondary | ICD-10-CM | POA: Diagnosis not present

## 2017-12-10 DIAGNOSIS — D120 Benign neoplasm of cecum: Secondary | ICD-10-CM | POA: Diagnosis not present

## 2017-12-13 DIAGNOSIS — D120 Benign neoplasm of cecum: Secondary | ICD-10-CM | POA: Diagnosis not present

## 2017-12-13 DIAGNOSIS — D122 Benign neoplasm of ascending colon: Secondary | ICD-10-CM | POA: Diagnosis not present

## 2018-03-11 ENCOUNTER — Ambulatory Visit
Admission: RE | Admit: 2018-03-11 | Discharge: 2018-03-11 | Disposition: A | Discharge status: Home / Self Care | Payer: BLUE CROSS/BLUE SHIELD | Source: Ambulatory Visit | Attending: Internal Medicine | Admitting: Internal Medicine

## 2018-03-11 ENCOUNTER — Other Ambulatory Visit: Payer: Self-pay | Admitting: Internal Medicine

## 2018-03-11 DIAGNOSIS — R0989 Other specified symptoms and signs involving the circulatory and respiratory systems: Secondary | ICD-10-CM | POA: Diagnosis not present

## 2018-03-11 DIAGNOSIS — R509 Fever, unspecified: Secondary | ICD-10-CM | POA: Diagnosis not present

## 2018-03-11 DIAGNOSIS — R05 Cough: Secondary | ICD-10-CM | POA: Diagnosis not present

## 2018-03-11 DIAGNOSIS — R000 Tachycardia, unspecified: Secondary | ICD-10-CM | POA: Diagnosis not present

## 2018-03-11 DIAGNOSIS — H6693 Otitis media, unspecified, bilateral: Secondary | ICD-10-CM | POA: Diagnosis not present

## 2018-03-14 DIAGNOSIS — H6693 Otitis media, unspecified, bilateral: Secondary | ICD-10-CM | POA: Diagnosis not present

## 2018-03-14 DIAGNOSIS — R509 Fever, unspecified: Secondary | ICD-10-CM | POA: Diagnosis not present

## 2018-03-14 DIAGNOSIS — R05 Cough: Secondary | ICD-10-CM | POA: Diagnosis not present

## 2018-03-14 DIAGNOSIS — R0989 Other specified symptoms and signs involving the circulatory and respiratory systems: Secondary | ICD-10-CM | POA: Diagnosis not present

## 2018-03-19 DIAGNOSIS — H6981 Other specified disorders of Eustachian tube, right ear: Secondary | ICD-10-CM | POA: Diagnosis not present

## 2018-03-26 DIAGNOSIS — J343 Hypertrophy of nasal turbinates: Secondary | ICD-10-CM | POA: Diagnosis not present

## 2018-03-26 DIAGNOSIS — H6503 Acute serous otitis media, bilateral: Secondary | ICD-10-CM | POA: Diagnosis not present

## 2018-03-26 DIAGNOSIS — H906 Mixed conductive and sensorineural hearing loss, bilateral: Secondary | ICD-10-CM | POA: Diagnosis not present

## 2018-04-22 ENCOUNTER — Telehealth: Payer: Self-pay | Admitting: *Deleted

## 2018-04-22 NOTE — Telephone Encounter (Signed)
Spoke with patient and he did not want to do a virtual visit. I told patient that I will cancel appointment for April 29, 2018 with Dr. Rennis Golden and to call back to reschedule in August or September.

## 2018-04-29 ENCOUNTER — Ambulatory Visit: Payer: BLUE CROSS/BLUE SHIELD | Admitting: Internal Medicine

## 2018-05-29 DIAGNOSIS — L709 Acne, unspecified: Secondary | ICD-10-CM | POA: Diagnosis not present

## 2018-09-21 IMAGING — CR DG CHEST 2V
2 series · 2 of 2 positions shown · non-contrast
Comparison: 09/30/2013

CLINICAL DATA: Chronic left-sided chest pain

EXAM:
CHEST - 2 VIEW

[w chest pa]
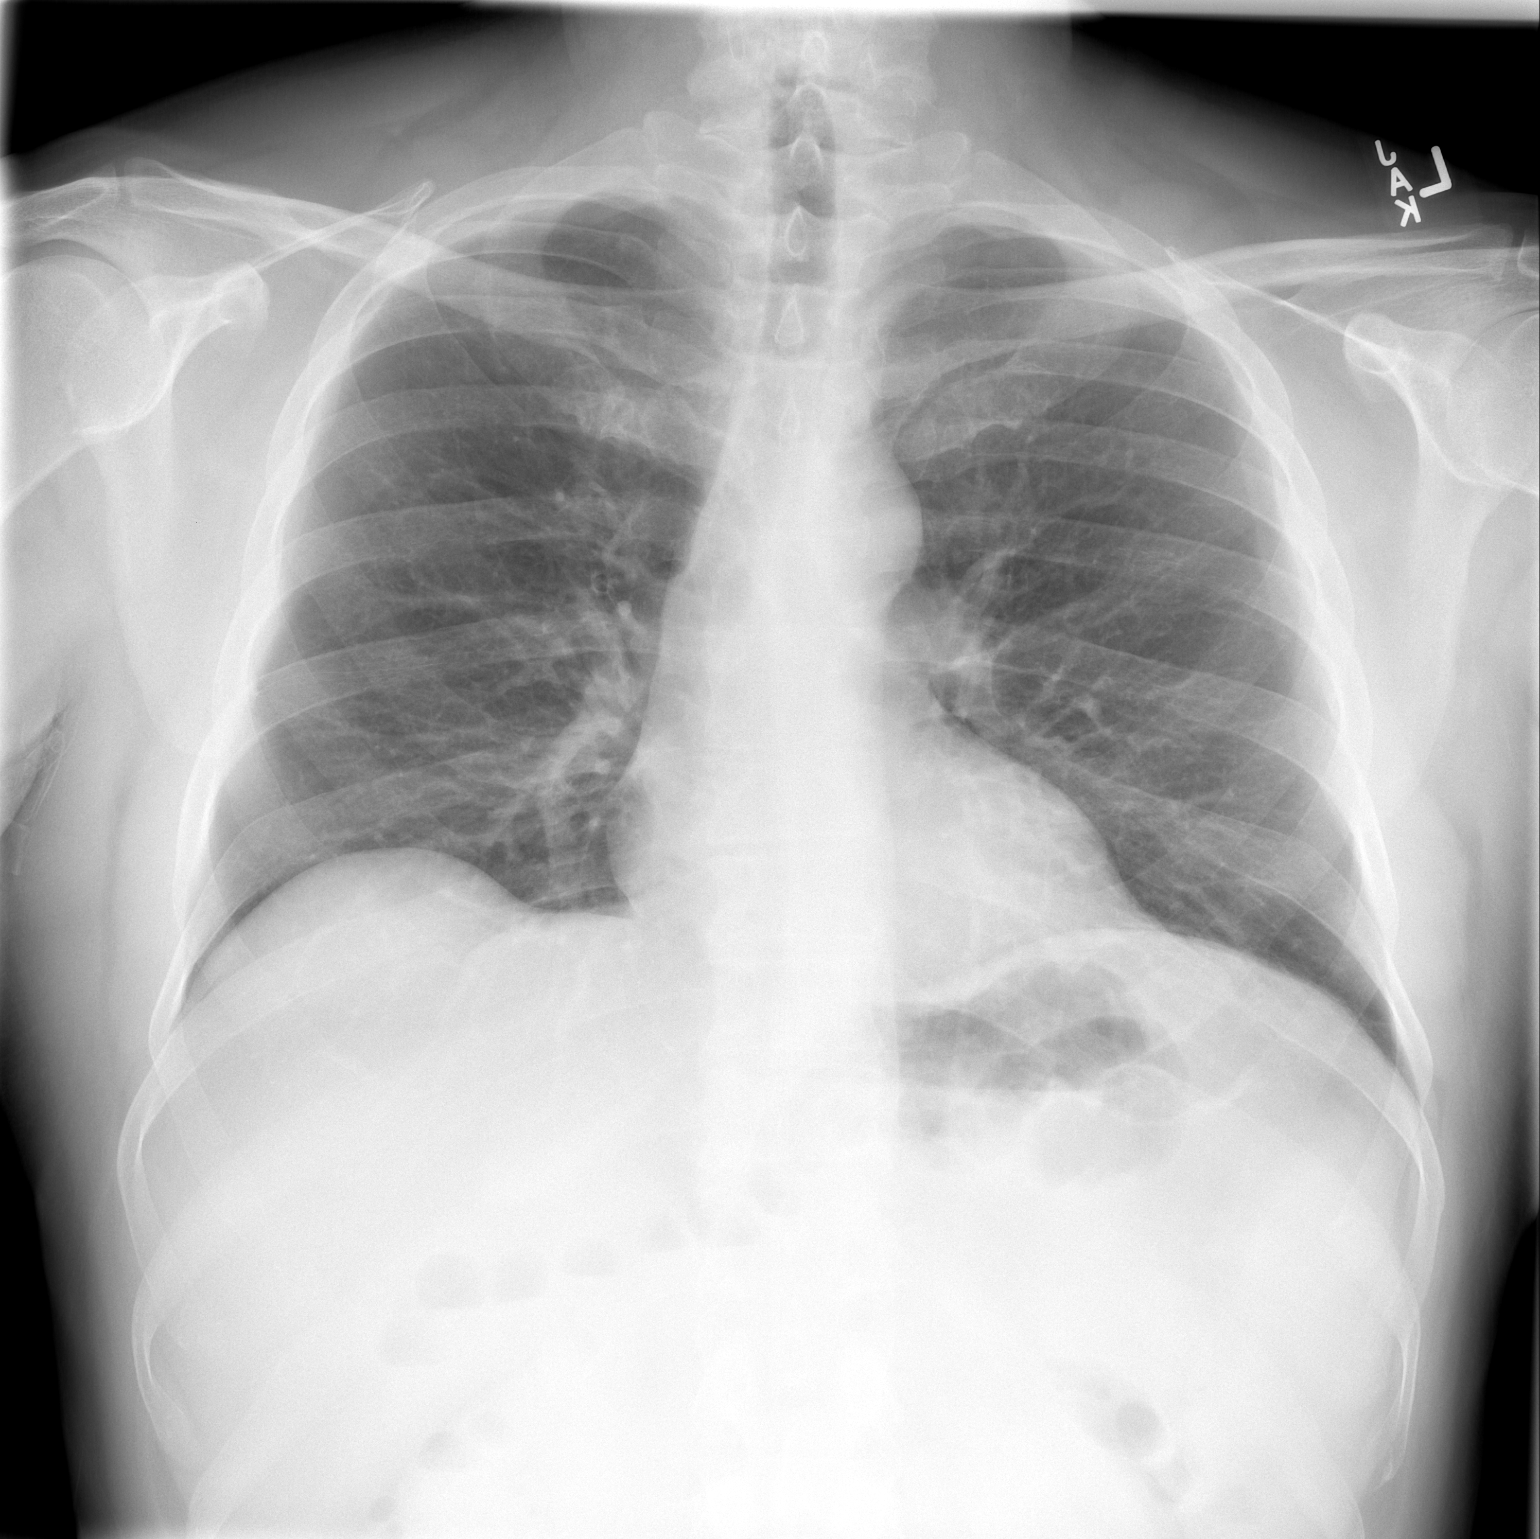

[w chest lat]
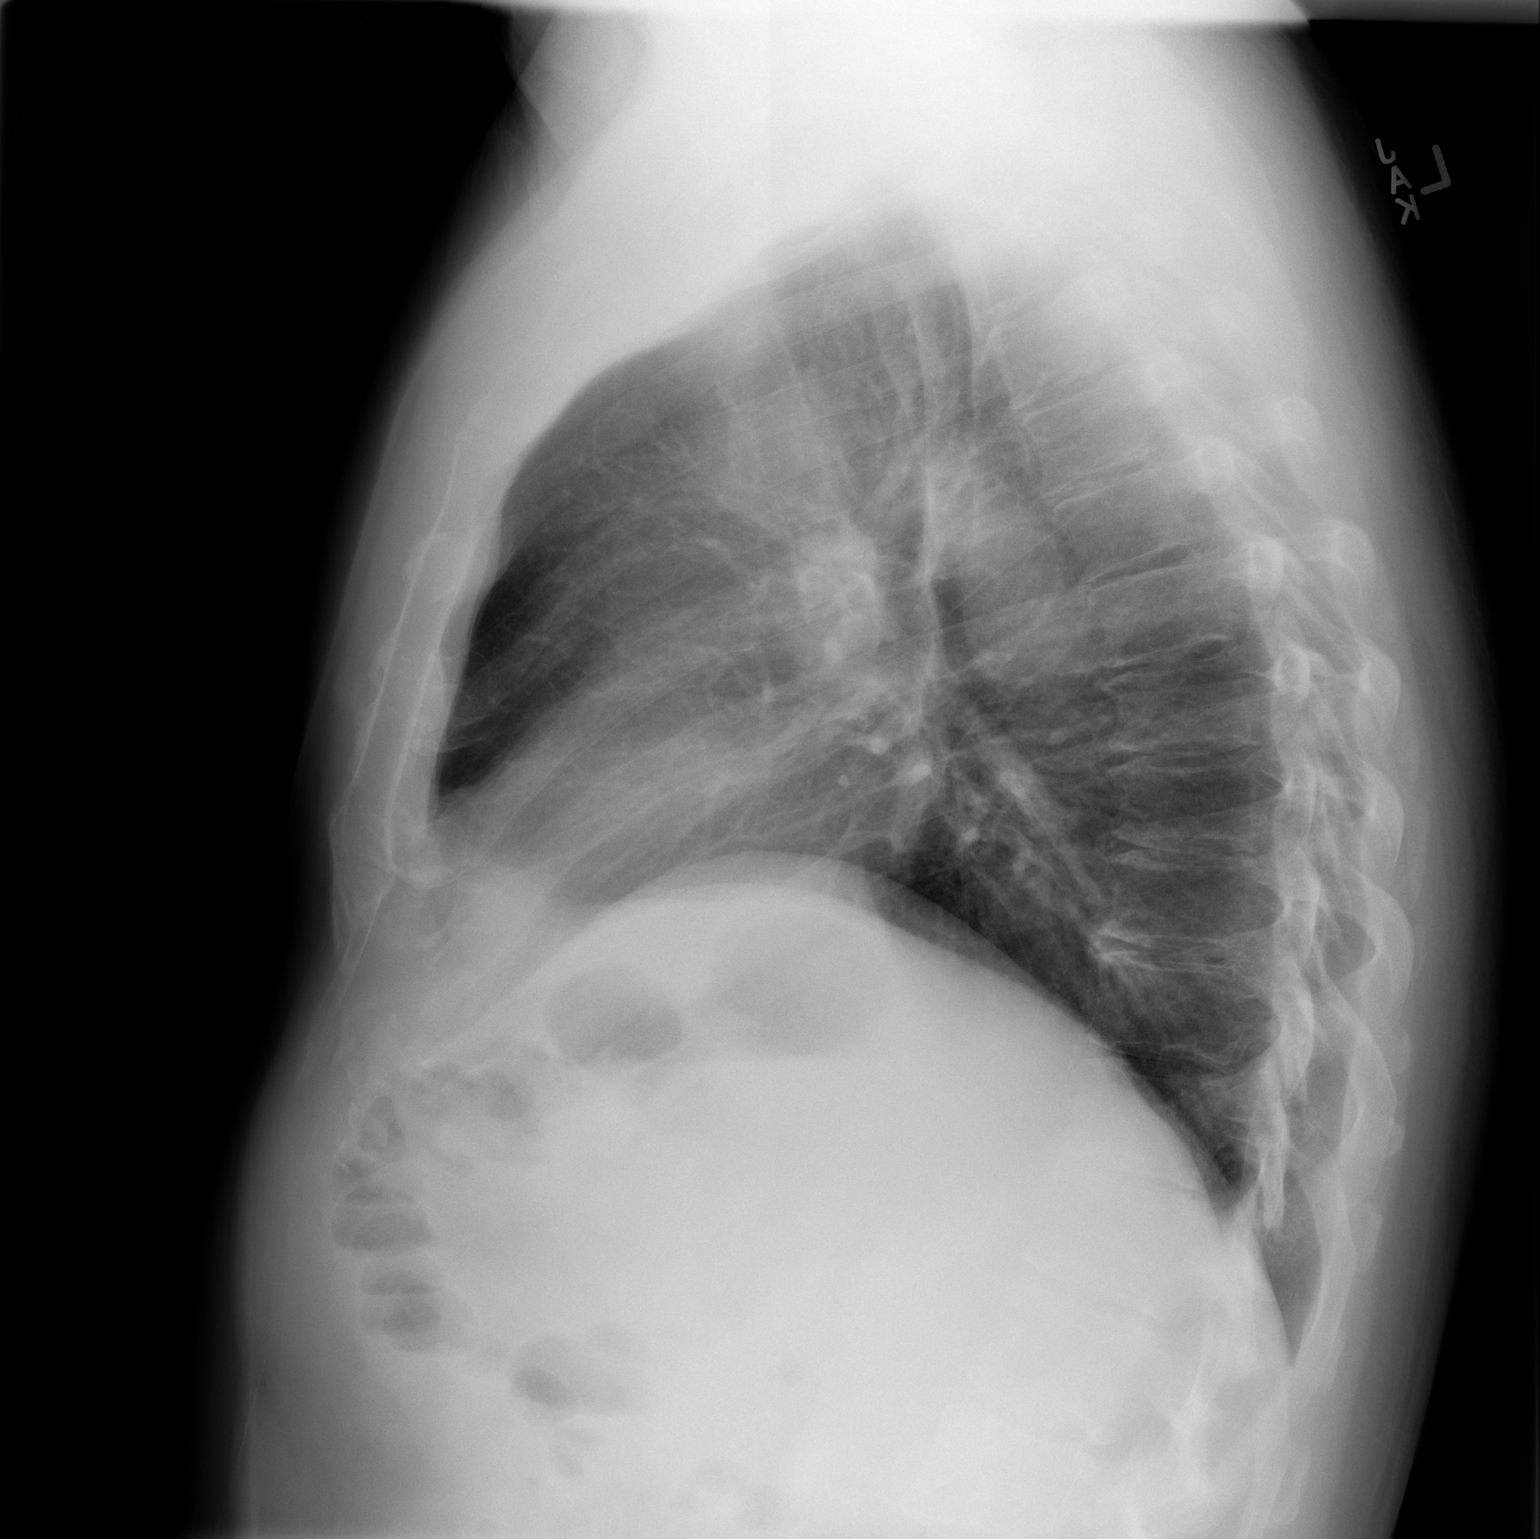

[2 of 2 positions shown; findings below may reference images not displayed]

FINDINGS: The heart size and mediastinal contours are within normal limits.
Both lungs are clear. The visualized skeletal structures are
unremarkable.
IMPRESSION: No active cardiopulmonary disease.

## 2018-10-28 ENCOUNTER — Encounter: Payer: Self-pay | Admitting: Internal Medicine

## 2018-11-26 ENCOUNTER — Ambulatory Visit: Payer: BLUE CROSS/BLUE SHIELD | Admitting: Cardiovascular Disease

## 2018-11-26 DIAGNOSIS — E782 Mixed hyperlipidemia: Secondary | ICD-10-CM | POA: Diagnosis not present

## 2018-11-26 DIAGNOSIS — R000 Tachycardia, unspecified: Secondary | ICD-10-CM | POA: Diagnosis not present

## 2018-11-26 DIAGNOSIS — Z125 Encounter for screening for malignant neoplasm of prostate: Secondary | ICD-10-CM | POA: Diagnosis not present

## 2018-11-26 DIAGNOSIS — R42 Dizziness and giddiness: Secondary | ICD-10-CM | POA: Diagnosis not present

## 2018-11-26 DIAGNOSIS — Z86010 Personal history of colonic polyps: Secondary | ICD-10-CM | POA: Diagnosis not present

## 2018-11-26 DIAGNOSIS — Z0000 Encounter for general adult medical examination without abnormal findings: Secondary | ICD-10-CM | POA: Diagnosis not present

## 2018-11-26 DIAGNOSIS — E559 Vitamin D deficiency, unspecified: Secondary | ICD-10-CM | POA: Diagnosis not present

## 2018-11-26 DIAGNOSIS — Z23 Encounter for immunization: Secondary | ICD-10-CM | POA: Diagnosis not present

## 2018-12-03 ENCOUNTER — Ambulatory Visit (INDEPENDENT_AMBULATORY_CARE_PROVIDER_SITE_OTHER): Payer: BC Managed Care – PPO | Admitting: Cardiovascular Disease

## 2018-12-03 ENCOUNTER — Other Ambulatory Visit: Payer: Self-pay

## 2018-12-03 ENCOUNTER — Encounter: Payer: Self-pay | Admitting: Cardiovascular Disease

## 2018-12-03 VITALS — BP 126/74 | HR 83 | Temp 97.7°F | Ht 70.0 in | Wt 200.0 lb

## 2018-12-03 DIAGNOSIS — R000 Tachycardia, unspecified: Secondary | ICD-10-CM | POA: Diagnosis not present

## 2018-12-03 DIAGNOSIS — Z789 Other specified health status: Secondary | ICD-10-CM | POA: Diagnosis not present

## 2018-12-03 NOTE — Progress Notes (Signed)
12/03/2018 Jonathan Winters   1960-09-26  [id number]  Primary Physician Marden Noble, MD Primary Cardiologist: Runell Gess MD Nicholes Calamity, [place]  HPI:  Jonathan Winters is a 58 y.o. mildly overweight married Caucasian male father of 1 child who is the global supply planner for [employer].  He was referred by Dr. Kevan Ny for cardiovascular valuation because of an episode of tachycardia that occurred back in March of this year in the setting of "the flu".  He has no cardiac risk factors.  He has never smoked.  There is no family history of heart disease.  Never had a heart attack or stroke.  Denies chest pain or shortness of breath.  He did have a negative stress test in the past for evaluation of dizziness and dyspnea which have resolved.    Current Meds  Medication Sig  . [DISCONTINUED] aspirin-acetaminophen-caffeine (EXCEDRIN MIGRAINE) 250-250-65 MG per tablet Take 2 tablets by mouth every 6 (six) hours as needed for headache.      No Known Allergies  Social History   Socioeconomic History  . Marital status: Married    Spouse name: Not on file  . Number of children: 1  . Years of education: College  . Highest education level: Not on file  Occupational History  . Occupation: Editor, [employer]ing [employer]  Social Needs  . Financial resource strain: Not on file  . Food insecurity    Worry: Not on file    Inability: Not on file  . Transportation needs    Medical: Not on file    Non-medical: Not on file  Tobacco Use  . Smoking status: Never Smoker  . Smokeless tobacco: Never Used  Substance and Sexual Activity  . Alcohol use: Yes    Comment: occasionally  . Drug use: No  . Sexual activity: Yes  Lifestyle  . Physical activity    Days per week: Not on file    Minutes per session: Not on file  . Stress: Not on file  Relationships  . Social Musician on phone: Not on file    Gets together: Not on file    Attends religious service: Not on file    Active member of  club or organization: Not on file    Attends meetings of clubs or organizations: Not on file    Relationship status: Not on file  . Intimate partner violence    Fear of current or ex partner: Not on file    Emotionally abused: Not on file    Physically abused: Not on file    Forced sexual activity: Not on file  Other Topics Concern  . Not on file  Social History Narrative   Lives at home w/ his wife and shared custody   Right-handed   Caffeine: rare     Review of Systems: General: negative for chills, fever, night sweats or weight changes.  Cardiovascular: negative for chest pain, dyspnea on exertion, edema, orthopnea, palpitations, paroxysmal nocturnal dyspnea or shortness of breath Dermatological: negative for rash Respiratory: negative for cough or wheezing Urologic: negative for hematuria Abdominal: negative for nausea, vomiting, diarrhea, bright red blood per rectum, melena, or hematemesis Neurologic: negative for visual changes, syncope, or dizziness All other systems reviewed and are otherwise negative except as noted above.    Blood pressure 126/74, pulse 83, temperature 97.7 F (36.5 C), height 5\' 10"  (1.778 m), weight 200 lb (90.7 kg).  General appearance: alert and no distress Neck: no adenopathy,  no carotid bruit, no JVD, supple, symmetrical, trachea midline and thyroid not enlarged, symmetric, no tenderness/mass/nodules Lungs: clear to auscultation bilaterally Heart: regular rate and rhythm, S1, S2 normal, no murmur, click, rub or gallop Extremities: extremities normal, atraumatic, no cyanosis or edema Pulses: 2+ and symmetric Skin: Skin color, texture, turgor normal. No rashes or lesions Neurologic: Alert and oriented X 3, normal strength and tone. Normal symmetric reflexes. Normal coordination and gait  EKG sinus rhythm at 83 without ST or T wave changes.  I personally reviewed this EKG.  ASSESSMENT AND PLAN:   Sinus tachycardia [name] was referred to me  by Dr. Inda Merlin for sinus tachycardia that occurred back in March in the setting of an upper respiratory tract infection.  He was briefly started on metoprolol which she did not tolerate and stopped.  He said no further tachycardia since that time.      Lorretta Harp MD FACP,FACC,FAHA, [facility] 12/03/2018 5:16 PM

## 2018-12-03 NOTE — Patient Instructions (Signed)

## 2018-12-03 NOTE — Assessment & Plan Note (Signed)
Mr. Lancon was referred to me by Dr. Inda Merlin for sinus tachycardia that occurred back in March in the setting of an upper respiratory tract infection.  He was briefly started on metoprolol which she did not tolerate and stopped.  He said no further tachycardia since that time.

## 2019-03-14 ENCOUNTER — Ambulatory Visit: Payer: BC Managed Care – PPO | Attending: Internal Medicine

## 2019-03-14 DIAGNOSIS — Z23 Encounter for immunization: Secondary | ICD-10-CM

## 2019-03-14 NOTE — Progress Notes (Signed)
   Covid-19 Vaccination Clinic  Name:  Leopold Smyers    MRN: [id number] DOB: 30-Aug-1960  03/14/2019  Mr. Bondar was observed post Covid-19 immunization for 15 minutes without incident. He was provided with Vaccine Information Sheet and instruction to access the V-Safe system.   Mr. Poss was instructed to call 911 with any severe reactions post vaccine: Marland Kitchen Difficulty breathing  . Swelling of face and throat  . A fast heartbeat  . A bad rash all over body  . Dizziness and weakness   Immunizations Administered    Name Date Dose VIS Date Route   Pfizer COVID-19 Vaccine 03/14/2019  3:55 PM 0.3 mL 12/13/2018 Intramuscular   Manufacturer: [employer], [employer]   Lot: [id number]   NDC: [address]-7

## 2019-04-07 ENCOUNTER — Ambulatory Visit: Payer: BC Managed Care – PPO | Attending: Internal Medicine

## 2019-04-07 DIAGNOSIS — Z23 Encounter for immunization: Secondary | ICD-10-CM

## 2019-04-07 NOTE — Progress Notes (Signed)
   Covid-19 Vaccination Clinic  Name:  Markham Dumlao    MRN: [id number] DOB: Jan 13, 1960  04/07/2019  Mr. Morozov was observed post Covid-19 immunization for 15 minutes without incident. He was provided with Vaccine Information Sheet and instruction to access the V-Safe system.   Mr. Gerst was instructed to call 911 with any severe reactions post vaccine: Marland Kitchen Difficulty breathing  . Swelling of face and throat  . A fast heartbeat  . A bad rash all over body  . Dizziness and weakness   Immunizations Administered    Name Date Dose VIS Date Route   Pfizer COVID-19 Vaccine 04/07/2019  5:28 PM 0.3 mL 12/13/2018 Intramuscular   Manufacturer: [employer], [employer]   Lot: [id number]   NDC: [address]-9

## 2019-06-10 IMAGING — CR CHEST - 2 VIEW
2 series · 2 of 2 positions shown · non-contrast
Comparison: 06/22/2017 chest radiograph

CLINICAL DATA: 58 y/o M; 5 days of fever with cough and congestion
for 1 week.

EXAM:
CHEST - 2 VIEW

[w chest pa]
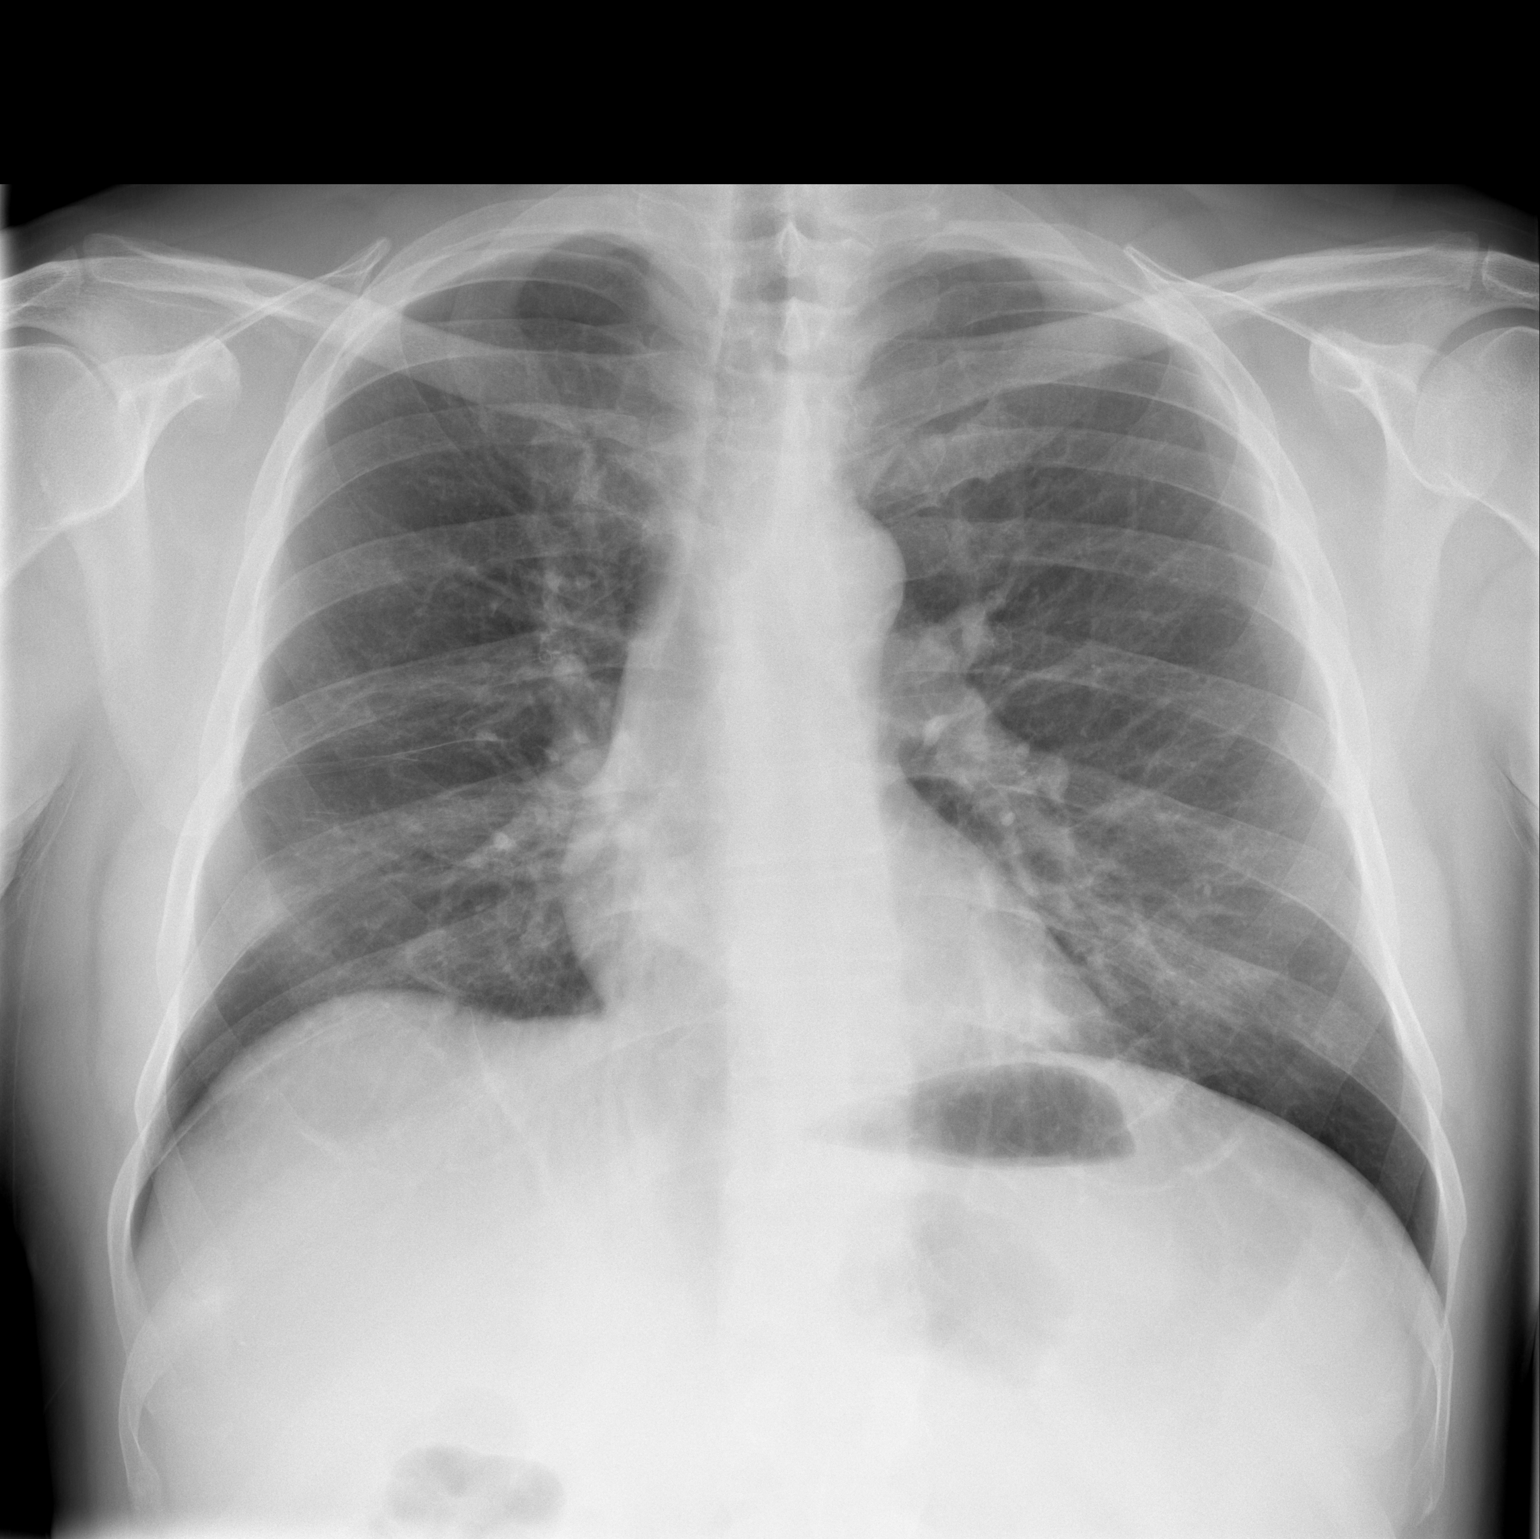

[w chest lat]
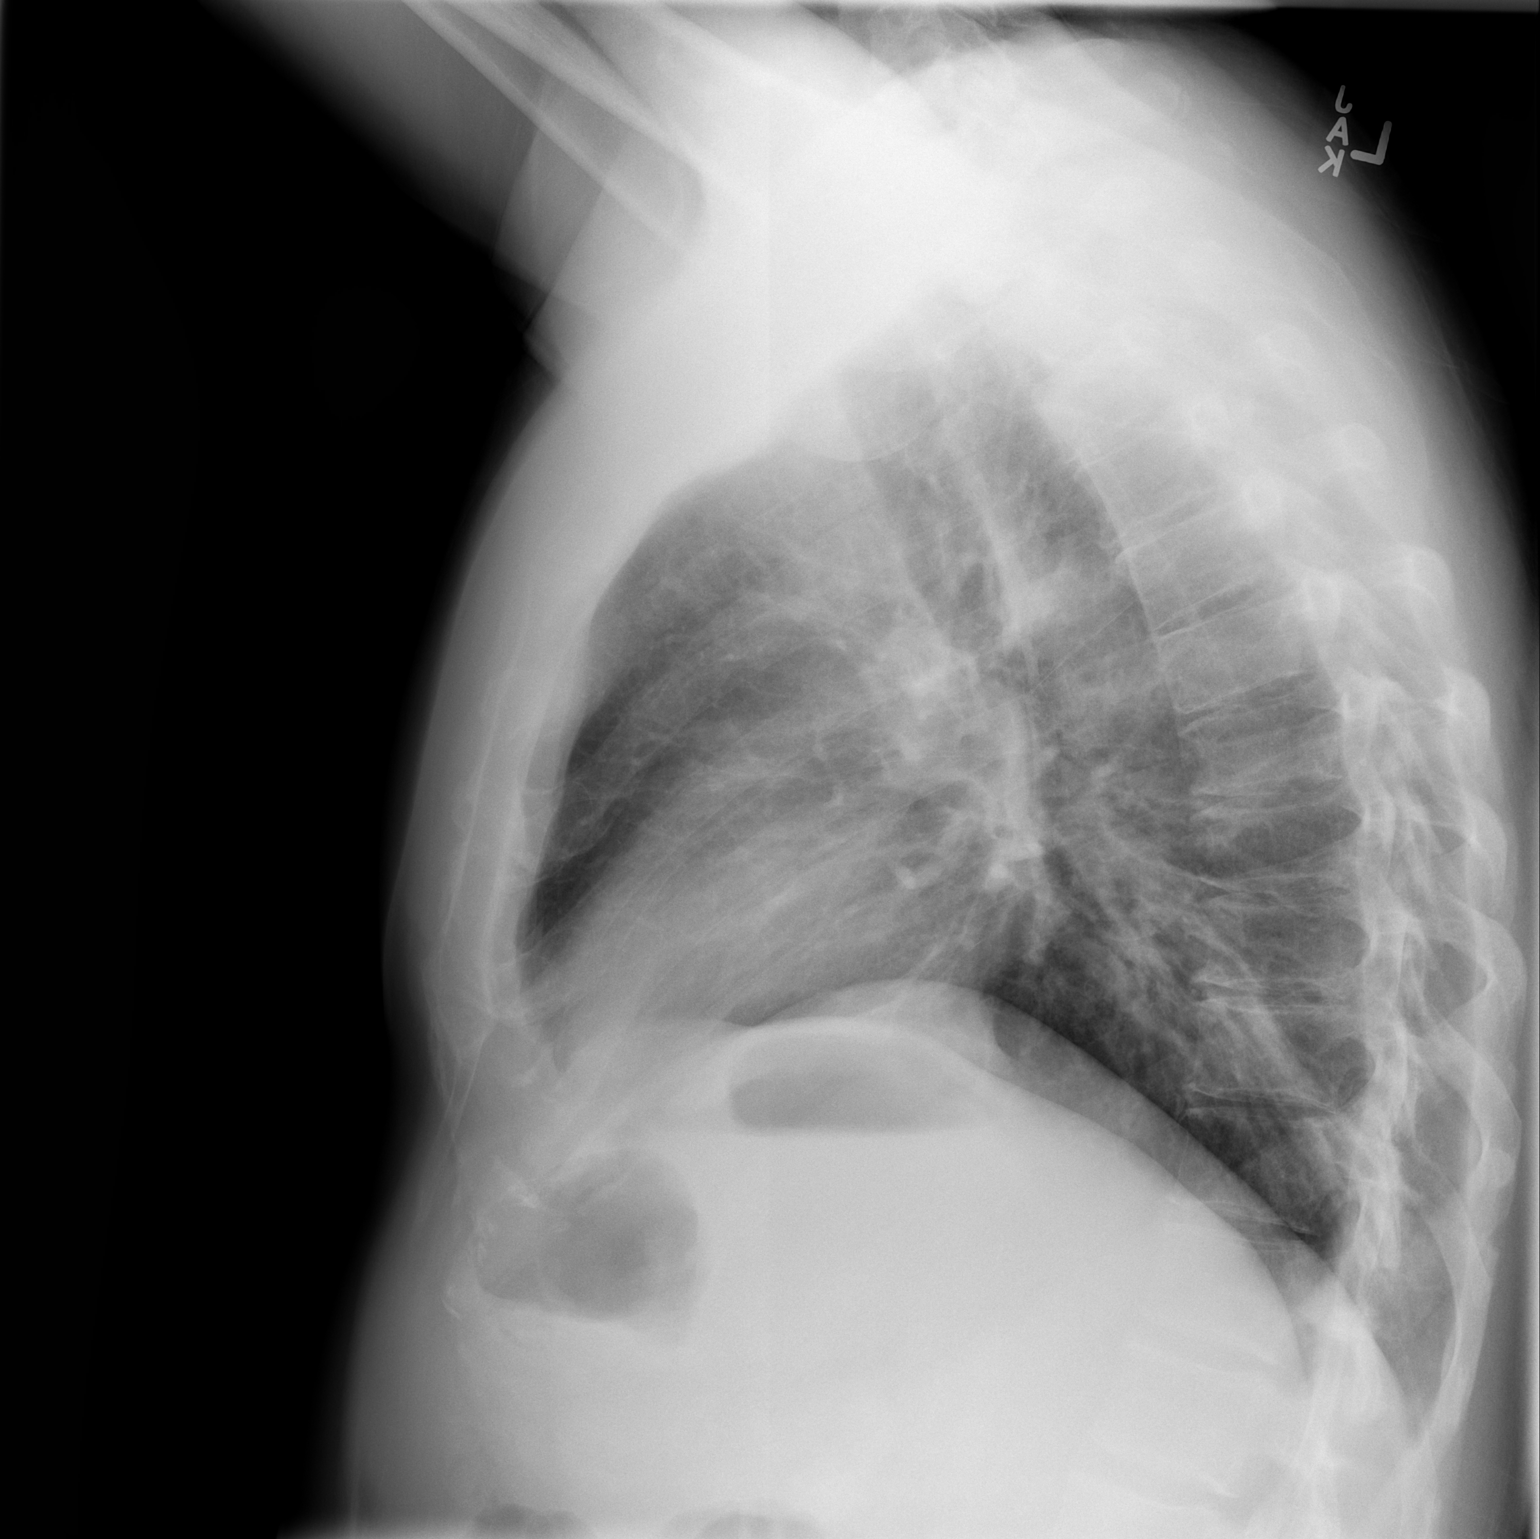

[2 of 2 positions shown; findings below may reference images not displayed]

FINDINGS: Normal cardiac silhouette given projection and technique. Increased
central bronchitic changes. No consolidation, effusion, or
pneumothorax. Bones are unremarkable.
IMPRESSION: Increased central bronchitic changes. No consolidation.

## 2019-09-11 DIAGNOSIS — J029 Acute pharyngitis, unspecified: Secondary | ICD-10-CM | POA: Diagnosis not present

## 2019-12-04 DIAGNOSIS — Z0000 Encounter for general adult medical examination without abnormal findings: Secondary | ICD-10-CM | POA: Diagnosis not present

## 2019-12-04 DIAGNOSIS — Z79899 Other long term (current) drug therapy: Secondary | ICD-10-CM | POA: Diagnosis not present

## 2019-12-04 DIAGNOSIS — Z86010 Personal history of colonic polyps: Secondary | ICD-10-CM | POA: Diagnosis not present

## 2019-12-04 DIAGNOSIS — Z23 Encounter for immunization: Secondary | ICD-10-CM | POA: Diagnosis not present

## 2019-12-04 DIAGNOSIS — E559 Vitamin D deficiency, unspecified: Secondary | ICD-10-CM | POA: Diagnosis not present

## 2019-12-04 DIAGNOSIS — K219 Gastro-esophageal reflux disease without esophagitis: Secondary | ICD-10-CM | POA: Diagnosis not present

## 2019-12-04 DIAGNOSIS — R251 Tremor, unspecified: Secondary | ICD-10-CM | POA: Diagnosis not present

## 2019-12-04 DIAGNOSIS — E782 Mixed hyperlipidemia: Secondary | ICD-10-CM | POA: Diagnosis not present

## 2019-12-04 DIAGNOSIS — N529 Male erectile dysfunction, unspecified: Secondary | ICD-10-CM | POA: Diagnosis not present

## 2019-12-04 DIAGNOSIS — Z125 Encounter for screening for malignant neoplasm of prostate: Secondary | ICD-10-CM | POA: Diagnosis not present

## 2019-12-19 DIAGNOSIS — R972 Elevated prostate specific antigen [PSA]: Secondary | ICD-10-CM | POA: Diagnosis not present

## 2019-12-22 DIAGNOSIS — R972 Elevated prostate specific antigen [PSA]: Secondary | ICD-10-CM | POA: Diagnosis not present

## 2020-02-02 DIAGNOSIS — R972 Elevated prostate specific antigen [PSA]: Secondary | ICD-10-CM | POA: Diagnosis not present

## 2020-04-19 DIAGNOSIS — R972 Elevated prostate specific antigen [PSA]: Secondary | ICD-10-CM | POA: Diagnosis not present

## 2020-04-26 DIAGNOSIS — R972 Elevated prostate specific antigen [PSA]: Secondary | ICD-10-CM | POA: Diagnosis not present

## 2020-08-03 DIAGNOSIS — R972 Elevated prostate specific antigen [PSA]: Secondary | ICD-10-CM | POA: Diagnosis not present

## 2020-08-10 DIAGNOSIS — R3915 Urgency of urination: Secondary | ICD-10-CM | POA: Diagnosis not present

## 2020-08-10 DIAGNOSIS — R972 Elevated prostate specific antigen [PSA]: Secondary | ICD-10-CM | POA: Diagnosis not present

## 2020-11-03 ENCOUNTER — Ambulatory Visit (INDEPENDENT_AMBULATORY_CARE_PROVIDER_SITE_OTHER): Payer: BC Managed Care – PPO | Admitting: Dermatology

## 2020-11-03 ENCOUNTER — Encounter: Payer: Self-pay | Admitting: Dermatology

## 2020-11-03 ENCOUNTER — Other Ambulatory Visit: Payer: Self-pay

## 2020-11-03 DIAGNOSIS — Z1283 Encounter for screening for malignant neoplasm of skin: Secondary | ICD-10-CM | POA: Diagnosis not present

## 2020-11-03 DIAGNOSIS — L821 Other seborrheic keratosis: Secondary | ICD-10-CM

## 2020-11-03 DIAGNOSIS — Z808 Family history of malignant neoplasm of other organs or systems: Secondary | ICD-10-CM | POA: Diagnosis not present

## 2020-11-22 ENCOUNTER — Encounter: Payer: Self-pay | Admitting: Dermatology

## 2020-11-22 NOTE — Progress Notes (Signed)
   New Patient   Subjective  Jonathan Winters is a 60 y.o. male who presents for the following: New Patient (Initial Visit) (Patient here to have several lesions checked. Per patient he has three lesions on his right flank x unsure no bleeding. One lesions on his left flank x unsure, two lesions on his neck x unsure, and one lesion on the left ear x unsure no bleeding from any of the lesions. No personal history of atypical moles, melanoma or non mole skin cancer. Family history of non mole skin cancer. ).  General skin examination, several spots noted by patient Location:  Duration:  Quality:  Associated Signs/Symptoms: Modifying Factors:  Severity:  Timing: Context:    The following portions of the chart were reviewed this encounter and updated as appropriate:  Tobacco  Allergies  Meds  Problems  Med Hx  Surg Hx  Fam Hx      Objective  Well appearing patient in no apparent distress; mood and affect are within normal limits. Head Seborrheic keratosis right front rib, left flank &neck line. Also patient has multiple angiomas.  No atypical pigmented lesions or nonmelanoma skin cancer.  Left Lower Back Multiple 6 mm flattopped brown textured papules; dermoscopy confirms.    A full examination was performed including scalp, head, eyes, ears, nose, lips, neck, chest, axillae, abdomen, back, buttocks, bilateral upper extremities, bilateral lower extremities, hands, feet, fingers, toes, fingernails, and toenails. All findings within normal limits unless otherwise noted below.   Assessment & Plan  Screening exam for skin cancer Head  No atypia or skin cancer found patient can keep yearly skin checks  Seborrheic keratosis Left Lower Back  Patient chooses to leave unless there is clinical change.

## 2020-12-02 DIAGNOSIS — R82998 Other abnormal findings in urine: Secondary | ICD-10-CM | POA: Diagnosis not present

## 2020-12-17 DIAGNOSIS — R972 Elevated prostate specific antigen [PSA]: Secondary | ICD-10-CM | POA: Diagnosis not present

## 2020-12-17 DIAGNOSIS — Z0000 Encounter for general adult medical examination without abnormal findings: Secondary | ICD-10-CM | POA: Diagnosis not present

## 2020-12-17 DIAGNOSIS — Z79899 Other long term (current) drug therapy: Secondary | ICD-10-CM | POA: Diagnosis not present

## 2020-12-17 DIAGNOSIS — R82998 Other abnormal findings in urine: Secondary | ICD-10-CM | POA: Diagnosis not present

## 2020-12-17 DIAGNOSIS — K219 Gastro-esophageal reflux disease without esophagitis: Secondary | ICD-10-CM | POA: Diagnosis not present

## 2020-12-17 DIAGNOSIS — E559 Vitamin D deficiency, unspecified: Secondary | ICD-10-CM | POA: Diagnosis not present

## 2020-12-17 DIAGNOSIS — E782 Mixed hyperlipidemia: Secondary | ICD-10-CM | POA: Diagnosis not present

## 2020-12-17 DIAGNOSIS — Z23 Encounter for immunization: Secondary | ICD-10-CM | POA: Diagnosis not present

## 2020-12-17 DIAGNOSIS — N529 Male erectile dysfunction, unspecified: Secondary | ICD-10-CM | POA: Diagnosis not present

## 2020-12-17 DIAGNOSIS — Z125 Encounter for screening for malignant neoplasm of prostate: Secondary | ICD-10-CM | POA: Diagnosis not present

## 2021-04-19 DIAGNOSIS — N529 Male erectile dysfunction, unspecified: Secondary | ICD-10-CM | POA: Diagnosis not present

## 2021-04-19 DIAGNOSIS — I10 Essential (primary) hypertension: Secondary | ICD-10-CM | POA: Diagnosis not present

## 2021-04-19 DIAGNOSIS — L639 Alopecia areata, unspecified: Secondary | ICD-10-CM | POA: Diagnosis not present

## 2021-05-10 ENCOUNTER — Ambulatory Visit (INDEPENDENT_AMBULATORY_CARE_PROVIDER_SITE_OTHER): Payer: BC Managed Care – PPO | Admitting: Dermatology

## 2021-05-10 ENCOUNTER — Encounter: Payer: Self-pay | Admitting: Dermatology

## 2021-05-10 DIAGNOSIS — L639 Alopecia areata, unspecified: Secondary | ICD-10-CM | POA: Diagnosis not present

## 2021-05-10 MED ORDER — CLOBETASOL PROPIONATE 0.05 % EX FOAM: Freq: Two times a day (BID) | CUTANEOUS | 3 refills | Status: DC

## 2021-05-10 NOTE — Progress Notes (Signed)
? ?  New Patient ?  ?Subjective  ?Jonathan Winters is a 61 y.o. male who presents for the following: Alopecia (Bald patches on the scalp x3 areas started 5 months ago tx minoxidil otc ). ? ?Hair loss scalp for several months. ?Location:  ?Duration:  ?Quality:  ?Associated Signs/Symptoms: ?Modifying Factors:  ?Severity:  ?Timing: ?Context:  ? ? ?The following portions of the chart were reviewed this encounter and updated as appropriate:  Tobacco  Allergies  Meds  Problems  Med Hx  Surg Hx  Fam Hx   ?  ? ?Objective  ?Well appearing patient in no apparent distress; mood and affect are within normal limits. ?Scalp ?Largest patch probably 4 cm right posterior parietal with significant well anchored right ear central lesion all margins except posterior negative pull test.  To 1.5 cm patches without central new hair growth left lower posterior parietal.  No changes beard, eyelashes, eyebrows, nails.  Detailed discussion about the autoimmune nature of alopecia areata with the roughly 90% favorable long-term prognosis independent of therapy.  Patient was open about this being quite stressful for him. ? ? ? ?A focused examination was performed including scalp, face, hands.. Relevant physical exam findings are noted in the Assessment and Plan. ? ? ?Assessment & Plan  ?Alopecia areata ?Scalp ? ?First explained to him what would knowingly avoid any systemic steroid therapy for this.  We discussed topicals and intralesional steroids and data on newer JA K inhibitors.  He will initially use clobetasol foam daily for the next 8 to 10 weeks; recheck at that time. ? ?clobetasol (OLUX) 0.05 % topical foam - Scalp ?Apply topically 2 (two) times daily. ? ? ?

## 2021-06-01 DIAGNOSIS — L639 Alopecia areata, unspecified: Secondary | ICD-10-CM | POA: Diagnosis not present

## 2021-06-01 DIAGNOSIS — R972 Elevated prostate specific antigen [PSA]: Secondary | ICD-10-CM | POA: Diagnosis not present

## 2021-06-01 DIAGNOSIS — Z113 Encounter for screening for infections with a predominantly sexual mode of transmission: Secondary | ICD-10-CM | POA: Diagnosis not present

## 2021-06-01 DIAGNOSIS — I10 Essential (primary) hypertension: Secondary | ICD-10-CM | POA: Diagnosis not present

## 2021-06-02 ENCOUNTER — Telehealth: Payer: Self-pay | Admitting: *Deleted

## 2021-06-02 NOTE — Telephone Encounter (Signed)
Prior authorization done via cover my meds for patients clobetasol.

## 2021-06-14 DIAGNOSIS — Z125 Encounter for screening for malignant neoplasm of prostate: Secondary | ICD-10-CM | POA: Diagnosis not present

## 2021-06-21 DIAGNOSIS — R3915 Urgency of urination: Secondary | ICD-10-CM | POA: Diagnosis not present

## 2021-06-21 DIAGNOSIS — Z125 Encounter for screening for malignant neoplasm of prostate: Secondary | ICD-10-CM | POA: Diagnosis not present

## 2021-06-21 DIAGNOSIS — F5221 Male erectile disorder: Secondary | ICD-10-CM | POA: Diagnosis not present

## 2021-07-11 ENCOUNTER — Encounter: Payer: Self-pay | Admitting: Dermatology

## 2021-07-11 ENCOUNTER — Ambulatory Visit (INDEPENDENT_AMBULATORY_CARE_PROVIDER_SITE_OTHER): Payer: BC Managed Care – PPO | Admitting: Dermatology

## 2021-07-11 DIAGNOSIS — L639 Alopecia areata, unspecified: Secondary | ICD-10-CM

## 2021-07-11 MED ORDER — TRIAMCINOLONE ACETONIDE 10 MG/ML IJ SUSP
10.0000 mg | Freq: Once | INTRAMUSCULAR | Status: AC
  Administered 2021-07-11: 10 mg

## 2021-08-01 ENCOUNTER — Encounter: Payer: Self-pay | Admitting: Dermatology

## 2021-08-01 NOTE — Progress Notes (Signed)
   Follow-Up Visit   Subjective  Jonathan Winters is a 61 y.o. male who presents for the following: Follow-up (Patient wants injections today if possible for hair loss).  Alopecia areata Location:  Duration:  Quality:  Associated Signs/Symptoms: Modifying Factors:  Severity:  Timing: Context:   Objective  Well appearing patient in no apparent distress; mood and affect are within normal limits. Scalp Patient don't feel like the clobetasol is working.  He does have half dozen large areas with only modest central regrowth.  Again reviewed essentially all therapies including injections and the new oral Olumiant.    A focused examination was performed including head and neck. Relevant physical exam findings are noted in the Assessment and Plan.   Assessment & Plan    Alopecia areata Scalp  Pull test preformed by Dr Jorja Loa hair is filling in and it has a color change dark to grey.  Patient chooses to try proceeding with a possible series of diluted intralesional triamcinolone; risks include failure to respond and thinning/indentation of skin  triamcinolone acetonide (KENALOG) 10 MG/ML injection 10 mg - Scalp   Intralesional injection - Scalp Location: scalp  Informed Consent: Discussed risks (infection, pain, bleeding, bruising, thinning of the skin, loss of skin pigment, lack of resolution, and recurrence of lesion) and benefits of the procedure, as well as the alternatives. Informed consent was obtained. Preparation: The area was prepared a standard fashion.   Procedure Details: An intralesional injection was performed with Kenalog 10 mg/cc. 2 cc in total were injected.  Total number of injections: 10  Plan: The patient was instructed on post-op care. Recommend OTC analgesia as needed for pain.   Related Medications clobetasol (OLUX) 0.05 % topical foam Apply topically 2 (two) times daily.      I, Janalyn Harder, MD, have reviewed all documentation for this visit.   The documentation on 08/01/21 for the exam, diagnosis, procedures, and orders are all accurate and complete.

## 2021-08-03 ENCOUNTER — Other Ambulatory Visit: Payer: Self-pay | Admitting: Dermatology

## 2021-08-03 DIAGNOSIS — L639 Alopecia areata, unspecified: Secondary | ICD-10-CM

## 2021-08-16 ENCOUNTER — Ambulatory Visit: Payer: BC Managed Care – PPO | Admitting: Physician Assistant

## 2021-08-31 DIAGNOSIS — L638 Other alopecia areata: Secondary | ICD-10-CM | POA: Diagnosis not present

## 2021-08-31 DIAGNOSIS — D225 Melanocytic nevi of trunk: Secondary | ICD-10-CM | POA: Diagnosis not present

## 2021-08-31 DIAGNOSIS — L821 Other seborrheic keratosis: Secondary | ICD-10-CM | POA: Diagnosis not present

## 2021-09-01 DIAGNOSIS — I10 Essential (primary) hypertension: Secondary | ICD-10-CM | POA: Diagnosis not present

## 2021-09-01 DIAGNOSIS — L639 Alopecia areata, unspecified: Secondary | ICD-10-CM | POA: Diagnosis not present

## 2021-09-01 DIAGNOSIS — R972 Elevated prostate specific antigen [PSA]: Secondary | ICD-10-CM | POA: Diagnosis not present

## 2021-09-01 DIAGNOSIS — Z113 Encounter for screening for infections with a predominantly sexual mode of transmission: Secondary | ICD-10-CM | POA: Diagnosis not present

## 2021-09-01 DIAGNOSIS — N529 Male erectile dysfunction, unspecified: Secondary | ICD-10-CM | POA: Diagnosis not present

## 2021-09-01 DIAGNOSIS — N489 Disorder of penis, unspecified: Secondary | ICD-10-CM | POA: Diagnosis not present

## 2021-09-28 DIAGNOSIS — L630 Alopecia (capitis) totalis: Secondary | ICD-10-CM | POA: Diagnosis not present

## 2021-10-19 DIAGNOSIS — L638 Other alopecia areata: Secondary | ICD-10-CM | POA: Diagnosis not present

## 2021-11-07 ENCOUNTER — Ambulatory Visit: Payer: BC Managed Care – PPO | Admitting: Dermatology

## 2021-11-16 DIAGNOSIS — L638 Other alopecia areata: Secondary | ICD-10-CM | POA: Diagnosis not present

## 2021-12-14 DIAGNOSIS — L638 Other alopecia areata: Secondary | ICD-10-CM | POA: Diagnosis not present

## 2021-12-22 DIAGNOSIS — I10 Essential (primary) hypertension: Secondary | ICD-10-CM | POA: Diagnosis not present

## 2021-12-22 DIAGNOSIS — Z23 Encounter for immunization: Secondary | ICD-10-CM | POA: Diagnosis not present

## 2021-12-22 DIAGNOSIS — E782 Mixed hyperlipidemia: Secondary | ICD-10-CM | POA: Diagnosis not present

## 2021-12-22 DIAGNOSIS — Z0000 Encounter for general adult medical examination without abnormal findings: Secondary | ICD-10-CM | POA: Diagnosis not present

## 2021-12-22 DIAGNOSIS — E559 Vitamin D deficiency, unspecified: Secondary | ICD-10-CM | POA: Diagnosis not present

## 2021-12-22 DIAGNOSIS — L659 Nonscarring hair loss, unspecified: Secondary | ICD-10-CM | POA: Diagnosis not present

## 2022-01-11 DIAGNOSIS — L638 Other alopecia areata: Secondary | ICD-10-CM | POA: Diagnosis not present

## 2022-01-11 DIAGNOSIS — D170 Benign lipomatous neoplasm of skin and subcutaneous tissue of head, face and neck: Secondary | ICD-10-CM | POA: Diagnosis not present

## 2022-01-18 DIAGNOSIS — L989 Disorder of the skin and subcutaneous tissue, unspecified: Secondary | ICD-10-CM | POA: Diagnosis not present

## 2022-01-18 DIAGNOSIS — Z113 Encounter for screening for infections with a predominantly sexual mode of transmission: Secondary | ICD-10-CM | POA: Diagnosis not present

## 2022-02-08 DIAGNOSIS — L638 Other alopecia areata: Secondary | ICD-10-CM | POA: Diagnosis not present

## 2022-03-22 DIAGNOSIS — L638 Other alopecia areata: Secondary | ICD-10-CM | POA: Diagnosis not present

## 2022-04-27 DIAGNOSIS — L989 Disorder of the skin and subcutaneous tissue, unspecified: Secondary | ICD-10-CM | POA: Diagnosis not present

## 2022-04-27 DIAGNOSIS — I10 Essential (primary) hypertension: Secondary | ICD-10-CM | POA: Diagnosis not present

## 2022-04-27 DIAGNOSIS — Z79899 Other long term (current) drug therapy: Secondary | ICD-10-CM | POA: Diagnosis not present

## 2022-04-27 DIAGNOSIS — B009 Herpesviral infection, unspecified: Secondary | ICD-10-CM | POA: Diagnosis not present

## 2022-05-15 DIAGNOSIS — L638 Other alopecia areata: Secondary | ICD-10-CM | POA: Diagnosis not present

## 2022-07-19 DIAGNOSIS — L638 Other alopecia areata: Secondary | ICD-10-CM | POA: Diagnosis not present

## 2022-12-14 DIAGNOSIS — K635 Polyp of colon: Secondary | ICD-10-CM | POA: Diagnosis not present

## 2022-12-14 DIAGNOSIS — K6389 Other specified diseases of intestine: Secondary | ICD-10-CM | POA: Diagnosis not present

## 2022-12-14 DIAGNOSIS — Z860101 Personal history of adenomatous and serrated colon polyps: Secondary | ICD-10-CM | POA: Diagnosis not present

## 2022-12-14 DIAGNOSIS — K5289 Other specified noninfective gastroenteritis and colitis: Secondary | ICD-10-CM | POA: Diagnosis not present

## 2022-12-14 DIAGNOSIS — Z09 Encounter for follow-up examination after completed treatment for conditions other than malignant neoplasm: Secondary | ICD-10-CM | POA: Diagnosis not present

## 2022-12-14 DIAGNOSIS — K6289 Other specified diseases of anus and rectum: Secondary | ICD-10-CM | POA: Diagnosis not present

## 2022-12-14 DIAGNOSIS — D125 Benign neoplasm of sigmoid colon: Secondary | ICD-10-CM | POA: Diagnosis not present

## 2022-12-22 DIAGNOSIS — Z23 Encounter for immunization: Secondary | ICD-10-CM | POA: Diagnosis not present

## 2022-12-22 DIAGNOSIS — Z125 Encounter for screening for malignant neoplasm of prostate: Secondary | ICD-10-CM | POA: Diagnosis not present

## 2022-12-22 DIAGNOSIS — Z79899 Other long term (current) drug therapy: Secondary | ICD-10-CM | POA: Diagnosis not present

## 2022-12-22 DIAGNOSIS — Z0000 Encounter for general adult medical examination without abnormal findings: Secondary | ICD-10-CM | POA: Diagnosis not present

## 2022-12-22 DIAGNOSIS — E782 Mixed hyperlipidemia: Secondary | ICD-10-CM | POA: Diagnosis not present

## 2022-12-22 DIAGNOSIS — E559 Vitamin D deficiency, unspecified: Secondary | ICD-10-CM | POA: Diagnosis not present

## 2023-02-02 DIAGNOSIS — R972 Elevated prostate specific antigen [PSA]: Secondary | ICD-10-CM | POA: Diagnosis not present

## 2023-02-09 DIAGNOSIS — R972 Elevated prostate specific antigen [PSA]: Secondary | ICD-10-CM | POA: Diagnosis not present

## 2023-02-09 DIAGNOSIS — R3915 Urgency of urination: Secondary | ICD-10-CM | POA: Diagnosis not present

## 2023-02-13 DIAGNOSIS — I10 Essential (primary) hypertension: Secondary | ICD-10-CM | POA: Diagnosis not present

## 2023-02-13 DIAGNOSIS — Z113 Encounter for screening for infections with a predominantly sexual mode of transmission: Secondary | ICD-10-CM | POA: Diagnosis not present

## 2023-02-13 DIAGNOSIS — R300 Dysuria: Secondary | ICD-10-CM | POA: Diagnosis not present

## 2023-02-13 DIAGNOSIS — R972 Elevated prostate specific antigen [PSA]: Secondary | ICD-10-CM | POA: Diagnosis not present

## 2023-03-16 DIAGNOSIS — R972 Elevated prostate specific antigen [PSA]: Secondary | ICD-10-CM | POA: Diagnosis not present

## 2023-03-16 DIAGNOSIS — I10 Essential (primary) hypertension: Secondary | ICD-10-CM | POA: Diagnosis not present

## 2023-05-03 DIAGNOSIS — R972 Elevated prostate specific antigen [PSA]: Secondary | ICD-10-CM | POA: Diagnosis not present

## 2023-05-10 DIAGNOSIS — R972 Elevated prostate specific antigen [PSA]: Secondary | ICD-10-CM | POA: Diagnosis not present

## 2023-05-10 DIAGNOSIS — A630 Anogenital (venereal) warts: Secondary | ICD-10-CM | POA: Diagnosis not present

## 2023-06-04 DIAGNOSIS — D225 Melanocytic nevi of trunk: Secondary | ICD-10-CM | POA: Diagnosis not present

## 2023-06-04 DIAGNOSIS — Z1283 Encounter for screening for malignant neoplasm of skin: Secondary | ICD-10-CM | POA: Diagnosis not present

## 2023-06-04 DIAGNOSIS — L821 Other seborrheic keratosis: Secondary | ICD-10-CM | POA: Diagnosis not present

## 2023-06-04 DIAGNOSIS — I781 Nevus, non-neoplastic: Secondary | ICD-10-CM | POA: Diagnosis not present

## 2023-06-25 DIAGNOSIS — L638 Other alopecia areata: Secondary | ICD-10-CM | POA: Diagnosis not present

## 2023-07-23 DIAGNOSIS — L638 Other alopecia areata: Secondary | ICD-10-CM | POA: Diagnosis not present

## 2023-08-27 DIAGNOSIS — L638 Other alopecia areata: Secondary | ICD-10-CM | POA: Diagnosis not present
